# Patient Record
Sex: Male | Born: 1960 | Race: White | Hispanic: No | Smoking: Former smoker
Health system: Southern US, Community
[De-identification: ages and names within clinical notes are randomized; demographics above are authoritative.]

## PROBLEM LIST (undated history)

## (undated) DIAGNOSIS — M67922 Unspecified disorder of synovium and tendon, left upper arm: Secondary | ICD-10-CM

## (undated) DIAGNOSIS — Z8679 Personal history of other diseases of the circulatory system: Secondary | ICD-10-CM

## (undated) DIAGNOSIS — K219 Gastro-esophageal reflux disease without esophagitis: Secondary | ICD-10-CM

## (undated) DIAGNOSIS — M79642 Pain in left hand: Secondary | ICD-10-CM

## (undated) DIAGNOSIS — G4733 Obstructive sleep apnea (adult) (pediatric): Secondary | ICD-10-CM

## (undated) DIAGNOSIS — M79641 Pain in right hand: Secondary | ICD-10-CM

## (undated) DIAGNOSIS — I4510 Unspecified right bundle-branch block: Secondary | ICD-10-CM

## (undated) HISTORY — PX: CARPAL TUNNEL RELEASE: SHX101

## (undated) HISTORY — PX: JOINT REPLACEMENT: SHX530

## (undated) HISTORY — PX: TONSILLECTOMY: SUR1361

## (undated) HISTORY — PX: FRACTURE SURGERY: SHX138

## (undated) HISTORY — PX: ORIF ANKLE FRACTURE: SHX5408

---

## 1985-09-19 HISTORY — PX: UVULOPALATOPHARYNGOPLASTY: SHX827

## 2014-05-07 ENCOUNTER — Emergency Department (HOSPITAL_BASED_OUTPATIENT_CLINIC_OR_DEPARTMENT_OTHER): Payer: Managed Care, Other (non HMO)

## 2014-05-07 ENCOUNTER — Emergency Department (HOSPITAL_COMMUNITY)
Admission: EM | Admit: 2014-05-07 | Discharge: 2014-05-07 | Discharge status: Against Medical Advice | Payer: Managed Care, Other (non HMO) | Source: Home / Self Care | Attending: Emergency Medicine | Admitting: Emergency Medicine

## 2014-05-07 ENCOUNTER — Emergency Department (HOSPITAL_BASED_OUTPATIENT_CLINIC_OR_DEPARTMENT_OTHER)
Admission: EM | Admit: 2014-05-07 | Discharge: 2014-05-07 | Discharge status: Against Medical Advice | Payer: Managed Care, Other (non HMO) | Source: Home / Self Care | Attending: Emergency Medicine | Admitting: Emergency Medicine

## 2014-05-07 ENCOUNTER — Encounter (HOSPITAL_COMMUNITY): Payer: Self-pay | Admitting: Emergency Medicine

## 2014-05-07 ENCOUNTER — Encounter (HOSPITAL_BASED_OUTPATIENT_CLINIC_OR_DEPARTMENT_OTHER): Payer: Self-pay | Admitting: Emergency Medicine

## 2014-05-07 ENCOUNTER — Observation Stay (HOSPITAL_COMMUNITY)
Admission: EM | Admit: 2014-05-07 | Discharge: 2014-05-09 | Disposition: A | Discharge status: Home / Self Care | Payer: Managed Care, Other (non HMO) | Attending: Cardiology | Admitting: Cardiology

## 2014-05-07 DIAGNOSIS — R509 Fever, unspecified: Secondary | ICD-10-CM | POA: Diagnosis not present

## 2014-05-07 DIAGNOSIS — R079 Chest pain, unspecified: Secondary | ICD-10-CM | POA: Insufficient documentation

## 2014-05-07 DIAGNOSIS — R000 Tachycardia, unspecified: Secondary | ICD-10-CM

## 2014-05-07 DIAGNOSIS — Z87891 Personal history of nicotine dependence: Secondary | ICD-10-CM | POA: Insufficient documentation

## 2014-05-07 DIAGNOSIS — I313 Pericardial effusion (noninflammatory): Secondary | ICD-10-CM

## 2014-05-07 DIAGNOSIS — I3139 Other pericardial effusion (noninflammatory): Secondary | ICD-10-CM

## 2014-05-07 DIAGNOSIS — I319 Disease of pericardium, unspecified: Principal | ICD-10-CM | POA: Insufficient documentation

## 2014-05-07 DIAGNOSIS — R0602 Shortness of breath: Secondary | ICD-10-CM | POA: Diagnosis present

## 2014-05-07 DIAGNOSIS — H9209 Otalgia, unspecified ear: Secondary | ICD-10-CM | POA: Diagnosis not present

## 2014-05-07 DIAGNOSIS — R071 Chest pain on breathing: Secondary | ICD-10-CM

## 2014-05-07 LAB — COMPREHENSIVE METABOLIC PANEL
ALT: 57 U/L — ABNORMAL HIGH (ref 0–53)
ANION GAP: 14 (ref 5–15)
AST: 25 U/L (ref 0–37)
Albumin: 3.5 g/dL (ref 3.5–5.2)
Alkaline Phosphatase: 59 U/L (ref 39–117)
BILIRUBIN TOTAL: 1 mg/dL (ref 0.3–1.2)
BUN: 12 mg/dL (ref 6–23)
CALCIUM: 9.1 mg/dL (ref 8.4–10.5)
CHLORIDE: 99 meq/L (ref 96–112)
CO2: 24 meq/L (ref 19–32)
Creatinine, Ser: 0.8 mg/dL (ref 0.50–1.35)
GFR calc Af Amer: >90 mL/min (ref >90)
GFR calc non Af Amer: >90 mL/min (ref >90)
Glucose, Bld: 167 mg/dL — ABNORMAL HIGH (ref 70–99)
Potassium: 3.7 mEq/L (ref 3.7–5.3)
Sodium: 137 mEq/L (ref 137–147)
Total Protein: 7.6 g/dL (ref 6.0–8.3)

## 2014-05-07 LAB — CBC WITH DIFFERENTIAL/PLATELET
BASOS PCT: 1 % (ref 0–1)
Basophils Absolute: 0 10*3/uL (ref 0.0–0.1)
Eosinophils Absolute: 0 10*3/uL (ref 0.0–0.7)
Eosinophils Relative: 0 % (ref 0–5)
HEMATOCRIT: 38.7 % — AB (ref 39.0–52.0)
HEMOGLOBIN: 13.1 g/dL (ref 13.0–17.0)
LYMPHS PCT: 16 % (ref 12–46)
Lymphs Abs: 1.3 10*3/uL (ref 0.7–4.0)
MCH: 30.2 pg (ref 26.0–34.0)
MCHC: 33.9 g/dL (ref 30.0–36.0)
MCV: 89.2 fL (ref 78.0–100.0)
MONO ABS: 0.4 10*3/uL (ref 0.1–1.0)
MONOS PCT: 5 % (ref 3–12)
NEUTROS ABS: 6.5 10*3/uL (ref 1.7–7.7)
Neutrophils Relative %: 79 % — ABNORMAL HIGH (ref 43–77)
Platelets: 322 10*3/uL (ref 150–400)
RBC: 4.34 MIL/uL (ref 4.22–5.81)
RDW: 12.2 % (ref 11.5–15.5)
WBC: 8.3 10*3/uL (ref 4.0–10.5)

## 2014-05-07 LAB — PRO B NATRIURETIC PEPTIDE: PRO B NATRI PEPTIDE: 145.2 pg/mL — AB (ref 0–125)

## 2014-05-07 LAB — TROPONIN I: Troponin I: <0.3 ng/mL (ref <0.30)

## 2014-05-07 MED ORDER — ONDANSETRON HCL 4 MG/2ML IJ SOLN: 4.0000 mg | Freq: Four times a day (QID) | INTRAMUSCULAR | Status: DC | PRN

## 2014-05-07 MED ORDER — ACETAMINOPHEN 325 MG PO TABS
650.0000 mg | ORAL_TABLET | ORAL | Status: DC | PRN
  Administered 2014-05-08 (×3): 650 mg via ORAL
  Filled 2014-05-07 (×3): qty 2

## 2014-05-07 MED ORDER — ALPRAZOLAM 0.25 MG PO TABS
0.2500 mg | ORAL_TABLET | Freq: Two times a day (BID) | ORAL | Status: DC | PRN
  Administered 2014-05-08: 0.25 mg via ORAL
  Filled 2014-05-07: qty 1

## 2014-05-07 MED ORDER — IOHEXOL 350 MG/ML SOLN
100.0000 mL | Freq: Once | INTRAVENOUS | Status: AC | PRN
  Administered 2014-05-07: 100 mL via INTRAVENOUS

## 2014-05-07 MED ORDER — AMOXICILLIN-POT CLAVULANATE 875-125 MG PO TABS
1.0000 | ORAL_TABLET | Freq: Two times a day (BID) | ORAL | Status: DC
  Administered 2014-05-07 – 2014-05-09 (×4): 1 via ORAL
  Filled 2014-05-07 (×5): qty 1

## 2014-05-07 MED ORDER — ACETAMINOPHEN 325 MG PO TABS
650.0000 mg | ORAL_TABLET | Freq: Once | ORAL | Status: DC
  Filled 2014-05-07: qty 2

## 2014-05-07 MED ORDER — ZOLPIDEM TARTRATE 5 MG PO TABS: 5.0000 mg | ORAL_TABLET | Freq: Every evening | ORAL | Status: DC | PRN

## 2014-05-07 MED ORDER — MORPHINE SULFATE 4 MG/ML IJ SOLN: 4.0000 mg | Freq: Once | INTRAMUSCULAR | Status: DC

## 2014-05-07 NOTE — ED Provider Notes (Signed)
8:24 PM [facility] at [facility]. Pleuritic CP. Ct with large pericardial effusion. Pericardial effusion measures up to 3 cm in diameter. Pt insisted on going by private transportation. [facility], but [facility]. Discussed with pt. Can discuss with cardiology again and see if can work this up further [facility], but suspect would ultimately need transfer to [facility] for cardi[facility] and potentially CT surgery. Pt would just like to go to [facility] now and insists on driving himself. He has medical decision making capability.   Raeford RazorStephen Valena Ivanov, MD 05/07/14 2035

## 2014-05-07 NOTE — ED Notes (Signed)
[facility]

## 2014-05-07 NOTE — Discharge Instructions (Signed)
[facility] to [facility]. [address] [place], [place] [id number]

## 2014-05-07 NOTE — H&P (Signed)
Mitchell Cantu is an 53 y.o. male.   Chief Complaint: Shortness of breath and malaise HPI: Patient is a fairly active 53 year old gentleman who was admitted via emergency room, presenting with shortness of breath that started about 2 weeks ago. Patient has history of spider bite in his right eye and also left upper arm about 2 months ago was treated with antibiotics, thinks that it may help the black recluse spider.  2 weeks ago she started noticing shortness of breath with exertional activities, malaise and fatigue, stiffness his nose and ears but no cough or any discharge. His symptoms got worse, he came to the emergency room and was seen at [facility], CT scan of the chest to rule out pulmonary embolism essentially revealed a moderate-sized pericardial effusion measuring 3 cm. Patient was recommended admission and observation, patient walked home as he stated that he taken of it out. He then returned back to [facility] for further evaluation. Otherwise he states that he feels well, denies PND orthopnea, denies chest pain, has noticed mild low-grade temperature over the past 10 days to 2 weeks. He quit smoking about a year ago and uses electronic cigarettes. He does not use any illicit drugs, does not drink alcohol.  History reviewed. No pertinent past medical history. no history of hypertension, diabetes or hyperlipidemia.  History reviewed. No pertinent past surgical history.  History reviewed. No pertinent family history. is no family history of Provigil coronary artery disease or diabetes mellitus. Social History:  reports that he has never smoked. He does not have any smokeless tobacco history on file. He reports that he does not drink alcohol or use illicit drugs.  Allergies: No Known Allergies   (Not in a hospital admission)    Review of Systems - please see history of presenting illness, other systems negative.  Blood pressure 158/102, pulse 110, temperature 99 F (37.2  C), temperature source Oral, resp. rate 22, height 5' 9" (1.753 m), weight 99.791 kg (220 lb), SpO2 98.00%. General appearance: alert, cooperative, appears stated age and no distress Eyes: negative findings: lids and lashes normal Neck: no adenopathy, no carotid bruit, no JVD, supple, symmetrical, trachea midline and thyroid not enlarged, symmetric, no tenderness/mass/nodules Neck: JVP - normal, carotids 2+= without bruits Resp: clear to auscultation bilaterally Chest wall: no tenderness Cardio: regular rate and rhythm, S1, S2 normal, no murmur, click, rub or gallop GI: soft, non-tender; bowel sounds normal; no masses,  no organomegaly Extremities: extremities normal, atraumatic, no cyanosis or edema Pulses: 2+ and symmetric Skin: Skin color, texture, turgor normal. No rashes or lesions Neurologic: Grossly normal  Results for orders placed during the hospital encounter of 05/07/14 (from the past 48 hour(s))  CBC WITH DIFFERENTIAL     Status: Abnormal   Collection Time    05/07/14  2:55 PM      Result Value Ref Range   WBC 8.3  4.0 - 10.5 K/uL   RBC 4.34  4.22 - 5.81 MIL/uL   Hemoglobin 13.1  13.0 - 17.0 g/dL   HCT 38.7 (*) 39.0 - 52.0 %   MCV 89.2  78.0 - 100.0 fL   MCH 30.2  26.0 - 34.0 pg   MCHC 33.9  30.0 - 36.0 g/dL   RDW 12.2  11.5 - 15.5 %   Platelets 322  150 - 400 K/uL   Neutrophils Relative % 79 (*) 43 - 77 %   Neutro Abs 6.5  1.7 - 7.7 K/uL   Lymphocytes Relative 16  12 - 46 %   Lymphs Abs 1.3  0.7 - 4.0 K/uL   Monocytes Relative 5  3 - 12 %   Monocytes Absolute 0.4  0.1 - 1.0 K/uL   Eosinophils Relative 0  0 - 5 %   Eosinophils Absolute 0.0  0.0 - 0.7 K/uL   Basophils Relative 1  0 - 1 %   Basophils Absolute 0.0  0.0 - 0.1 K/uL  COMPREHENSIVE METABOLIC PANEL     Status: Abnormal   Collection Time    05/07/14  2:55 PM      Result Value Ref Range   Sodium 137  137 - 147 mEq/L   Potassium 3.7  3.7 - 5.3 mEq/L   Chloride 99  96 - 112 mEq/L   CO2 24  19 - 32 mEq/L    Glucose, Bld 167 (*) 70 - 99 mg/dL   BUN 12  6 - 23 mg/dL   Creatinine, Ser 0.80  0.50 - 1.35 mg/dL   Calcium 9.1  8.4 - 10.5 mg/dL   Total Protein 7.6  6.0 - 8.3 g/dL   Albumin 3.5  3.5 - 5.2 g/dL   AST 25  0 - 37 U/L   ALT 57 (*) 0 - 53 U/L   Alkaline Phosphatase 59  39 - 117 U/L   Total Bilirubin 1.0  0.3 - 1.2 mg/dL   GFR calc non Af Amer >90  >90 mL/min   GFR calc Af Amer >90  >90 mL/min   Comment: (NOTE)     The eGFR has been calculated using the CKD EPI equation.     This calculation has not been validated in all clinical situations.     eGFR's persistently <90 mL/min signify possible Chronic Kidney     Disease.   Anion gap 14  5 - 15  TROPONIN I     Status: None   Collection Time    05/07/14  2:55 PM      Result Value Ref Range   Troponin I <0.30  <0.30 ng/mL   Comment:            Due to the release kinetics of cTnI,     a negative result within the first hours     of the onset of symptoms does not rule out     myocardial infarction with certainty.     If myocardial infarction is still suspected,     repeat the test at appropriate intervals.  PRO B NATRIURETIC PEPTIDE     Status: Abnormal   Collection Time    05/07/14  2:55 PM      Result Value Ref Range   Pro B Natriuretic peptide (BNP) 145.2 (*) 0 - 125 pg/mL   Dg Chest 2 View  05/07/2014   CLINICAL DATA:  Chest pain with shortness of breath and chest tightness  EXAM: CHEST  2 VIEW  COMPARISON:  None.  FINDINGS: The lungs are well-expanded. There is no focal infiltrate. The cardiopericardial silhouette is top-normal in size. The central pulmonary vascularity is mildly prominent. There is no pulmonary interstitial or alveolar edema. There is no pneumothorax or pleural effusion. The bony thorax is normal.  IMPRESSION: Mild central pulmonary vascular prominence and enlargement of the cardiac silhouette may reflect low-grade CHF. There is no pneumonia nor significant pulmonary alveolar edema.   Electronically Signed    By: David  Martinique   On: 05/07/2014 15:11   Ct Angio Chest Pe W/cm &/or Wo Cm  05/07/2014  CLINICAL DATA:  Pleuritic chest pain. Shortness of breath. Tachycardia .  EXAM: CT ANGIOGRAPHY CHEST WITH CONTRAST  TECHNIQUE: Multidetector CT imaging of the chest was performed using the standard protocol during bolus administration of intravenous contrast. Multiplanar CT image reconstructions and MIPs were obtained to evaluate the vascular anatomy.  CONTRAST:  135m OMNIPAQUE IOHEXOL 350 MG/ML SOLN  COMPARISON:  Chest x-ray 05/07/2014.  FINDINGS: Thoracic aorta is atherosclerotic. No evidence of aneurysmal dissection. Pulmonary arteries are normal. Heart size normal. Prominent pericardial effusion measuring up to 3 cm in diameter noted. Coronary artery disease cannot be excluded.  Shotty mediastinal lymph nodes.  Thoracic soft is unremarkable.  Large airways are patent. No focal pulmonary infiltrate. Small left pleural effusion. No pneumothorax.  Fatty infiltration of the liver. Visualized upper abdominal structures otherwise unremarkable. No acute bony abnormality.  Review of the MIP images confirms the above findings.  IMPRESSION: 1. Large pericardial effusion. Pericardial effusion measures up to 3 cm in diameter. 2. Coronary artery disease cannot be excluded. No evidence of pulmonary embolus. These results were called by telephone at the time of interpretation on 05/07/2014 at 3:56 pm to Dr. MErnestina Patches, who verbally acknowledged these results.   Electronically Signed   By: TMarcello Moores Register   On: 05/07/2014 15:57    Labs:   Lab Results  Component Value Date   WBC 8.3 05/07/2014   HGB 13.1 05/07/2014   HCT 38.7* 05/07/2014   MCV 89.2 05/07/2014   PLT 322 05/07/2014    Recent Labs Lab 05/07/14 1455  NA 137  K 3.7  CL 99  CO2 24  BUN 12  CREATININE 0.80  CALCIUM 9.1  PROT 7.6  BILITOT 1.0  ALKPHOS 59  ALT 57*  AST 25  GLUCOSE 167*   Lab Results  Component Value Date   TROPONINI <0.30  05/07/2014    Lipid Panel  No results found for this basename: chol, trig, hdl, cholhdl, vldl, ldlcalc    EKG: Sinus tachycardia rate of 100 beats a minute, normal axis, right bundle branch block. No evidence of ischemia.   Assessment/Plan 1. Shortness of breath, dyspnea on exertion, generalized fatigue and malaise associated with low-grade temperature ongoing for the past 2 weeks. 2. Pericardial effusion, moderate without evidence of pericardial tamponade clinically. Blood pressure well maintained. Pulses paradoxus was evaluated at the bedside, there was no difference in blood pressure with respiration.  Recommendation: I suspect his pericardial effusion is due to post viral syndrome. He still has the headache, earache but no discharge. No nuchal tenderness or rigidity. I will immediately start the patient on antibiotics. Probable discharge in the morning, will recommend to lay off of work for a week. And obtain an echocardiogram in the morning.  GLaverda Page MD 05/07/2014, 11:40 PM PM[facility]iovascular. PMonroePager: [phone] Office: [phone] no answer: Cell:  [phone]

## 2014-05-07 NOTE — ED Notes (Signed)
Called pt for triage X 3 with no answer  

## 2014-05-07 NOTE — ED Notes (Signed)
Reports CP with SHOB and tightness x 2 weeks. Sts feeling it deep in his chest. Thought he pulled muscle initially.

## 2014-05-07 NOTE — ED Notes (Signed)
Pt refusing to be admitted, however states he will go home from ED then return to [facility] for further evaluation. Discussed risks of leaving, AMA signed and pt verbalizes understanding.

## 2014-05-07 NOTE — ED Provider Notes (Signed)
CSN: [id number]     Arrival date & time 05/07/14  2121 History   First MD Initiated Contact with Patient 05/07/14 2254     Chief Complaint  Patient presents with  . Shortness of Breath    HPI Patient presents to the emergency room for further evaluation of a pericardial effusion.  Patient has been having trouble with shortness of breath and intermittent chest discomfort for the last couple of weeks.  He initially thought it was a muscle strain. He works at [employer]. He went and saw the nurse at work who listened to his heart and lungs and told him that his lungs were clear. Patient states he's felt more fatigued. He is also getting some shortness exertion.   [facility]. He had laboratory testing as well as a CT scan of his chest. Initially they were concerned about the possibility of a pulmonary embolism however the CT scan revealed a large pericardial effusion.   The [facility] however the patient refused ambulance transport. He decided to drive himself to the emergency department.  History reviewed. No pertinent past medical history. History reviewed. No pertinent past surgical history. History reviewed. No pertinent family history. History  Substance Use Topics  . Smoking status: Never Smoker   . Smokeless tobacco: Not on file  . Alcohol Use: No    Review of Systems  All other systems reviewed and are negative.     Allergies  Review of patient's allergies indicates no known allergies.  Home Medications   Prior to Admission medications   Not on File   BP 158/102  Pulse 110  Temp(Src) 99 F (37.2 C) (Oral)  Resp 22  Ht 5\' 9"  (1.753 m)  Wt 220 lb (99.791 kg)  BMI 32.47 kg/m2  SpO2 98% Physical Exam  Nursing note and vitals reviewed. Constitutional: He appears well-developed and well-nourished. No distress.  HENT:  Head: Normocephalic and atraumatic.  Right Ear: External ear  normal.  Left Ear: External ear normal.  Eyes: Conjunctivae are normal. Right eye exhibits no discharge. Left eye exhibits no discharge. No scleral icterus.  Neck: Neck supple. No tracheal deviation present.  Cardiovascular: Regular rhythm and intact distal pulses.  Tachycardia present.  Exam reveals friction rub.   Pulmonary/Chest: Effort normal and breath sounds normal. No stridor. No respiratory distress. He has no wheezes. He has no rales.  Abdominal: Soft. Bowel sounds are normal. He exhibits no distension. There is no tenderness. There is no rebound and no guarding.  Musculoskeletal: He exhibits no edema and no tenderness.  Neurological: He is alert. He has normal strength. No cranial nerve deficit (no facial droop, extraocular movements intact, no slurred speech) or sensory deficit. He exhibits normal muscle tone. He displays no seizure activity. Coordination normal.  Skin: Skin is warm and dry. No rash noted.  Psychiatric: He has a normal mood and affect.    ED Course  Procedures (including critical care time) Labs Review Labs Reviewed - No data to display  Imaging Review Dg Chest 2 View  05/07/2014   CLINICAL DATA:  Chest pain with shortness of breath and chest tightness  EXAM: CHEST  2 VIEW  COMPARISON:  None.  FINDINGS: The lungs are well-expanded. There is no focal infiltrate. The cardiopericardial silhouette is top-normal in size. The central pulmonary vascularity is mildly prominent. There is no pulmonary interstitial or alveolar edema. There is no pneumothorax or pleural  effusion. The bony thorax is normal.  IMPRESSION: Mild central pulmonary vascular prominence and enlargement of the cardiac silhouette may reflect low-grade CHF. There is no pneumonia nor significant pulmonary alveolar edema.   Electronically Signed   By: David  [place]   On: 05/07/2014 15:11   Ct Angio Chest Pe W/cm &/or Wo Cm  05/07/2014   CLINICAL DATA:  Pleuritic chest pain. Shortness of breath. Tachycardia .   EXAM: CT ANGIOGRAPHY CHEST WITH CONTRAST  TECHNIQUE: Multidetector CT imaging of the chest was performed using the standard protocol during bolus administration of intravenous contrast. Multiplanar CT image reconstructions and MIPs were obtained to evaluate the vascular anatomy.  CONTRAST:  OMNIPAQUE IOHEXOL 350 MG/ML SOLN  COMPARISON:  Chest x-ray 05/07/2014.  FINDINGS: Thoracic aorta is atherosclerotic. No evidence of aneurysmal dissection. Pulmonary arteries are normal. Heart size normal. Prominent pericardial effusion measuring up to 3 cm in diameter noted. Coronary artery disease cannot be excluded.  Shotty mediastinal lymph nodes.  Thoracic soft is unremarkable.  Large airways are patent. No focal pulmonary infiltrate. Small left pleural effusion. No pneumothorax.  Fatty infiltration of the liver. Visualized upper abdominal structures otherwise unremarkable. No acute bony abnormality.  Review of the MIP images confirms the above findings.  IMPRESSION: 1. Large pericardial effusion. Pericardial effusion measures up to 3 cm in diameter. 2. Coronary artery disease cannot be excluded. No evidence of pulmonary embolus. These results were called by telephone at the time of interpretation on 05/07/2014 at 3:56 pm to Dr. Toy Cookey , who verbally acknowledged these results.   Electronically Signed   By: Maisie Fus  Register   On: 05/07/2014 15:57     EKG Interpretation   Date/Time:  Wednesday May 07 2014 21:29:14 EDT Ventricular Rate:  113 PR Interval:  138 QRS Duration: 138 QT Interval:  350 QTC Calculation: 480 R Axis:   63 Text Interpretation:  Sinus tachycardia Right bundle branch block Abnormal  ECG No significant change since last tracing Confirmed by Jaylon Boylen  MD-J, Rosellen Lichtenberger  ([id number]) on 05/07/2014 11:14:44 PM      MDM   Final diagnoses:  Pericardial effusion    Pt had laboratory testing and CT scan performed today at [facility].     I discussed the case with Dr Jacinto Halim.  He will evaluate the  patient in the ED.    Linwood Dibbles, MD 05/07/14 [phone]

## 2014-05-07 NOTE — ED Notes (Signed)
[facility] and refused transport to [facility] d/t needed to feed dogs; pt reports having SOB and cp for a few weeks; [facility]; charge RN made aware that pt arrived

## 2014-05-07 NOTE — ED Notes (Signed)
Went to ED room and pt is sitting in chair fully dressed and tells me he wants to go home in his car, feed his dog, then go to [facility].  Discussed process for admission- EDP informed.

## 2014-05-07 NOTE — ED Notes (Signed)
[facility] and [facility].

## 2014-05-07 NOTE — ED Notes (Signed)
Patient transported to CT 

## 2014-05-07 NOTE — ED Provider Notes (Signed)
CSN: [id number]     Arrival date & time 05/07/14  1421 History   First MD Initiated Contact with Patient 05/07/14 1508     Chief Complaint  Patient presents with  . Chest Pain     (Consider location/radiation/quality/duration/timing/severity/associated sxs/prior Treatment) Patient is a 53 y.o. male presenting with chest pain. The history is provided by the patient. No language interpreter was used.  Chest Pain Pain location:  Substernal area Pain quality: sharp   Radiates to: occasional ear pain. Pain radiates to the back: no   Pain severity:  Moderate Onset quality:  Unable to specify Duration:  2 weeks Timing:  Constant Progression:  Unchanged Chronicity:  New Context: at rest   Relieved by:  Nothing Worsened by:  Coughing and deep breathing Ineffective treatments:  Rest Associated symptoms: shortness of breath   Associated symptoms: no abdominal pain, no back pain, no cough, no dizziness, no dysphagia, no fatigue, no fever, no headache, no nausea, no numbness, no syncope, not vomiting and no weakness   Risk factors: male sex   Risk factors: no coronary artery disease, no diabetes mellitus, no high cholesterol, no hypertension, no prior DVT/PE and no smoking (quit 1 yr ago)     History reviewed. No pertinent past medical history. History reviewed. No pertinent past surgical history. No family history on file. History  Substance Use Topics  . Smoking status: Never Smoker   . Smokeless tobacco: Not on file  . Alcohol Use: No    Review of Systems  Constitutional: Negative for fever, activity change, appetite change and fatigue.  HENT: Negative for congestion, facial swelling, rhinorrhea and trouble swallowing.   Eyes: Negative for photophobia and pain.  Respiratory: Positive for shortness of breath. Negative for cough and chest tightness.   Cardiovascular: Positive for chest pain. Negative for leg swelling and syncope.  Gastrointestinal: Negative for nausea, vomiting,  abdominal pain, diarrhea and constipation.  Endocrine: Negative for polydipsia and polyuria.  Genitourinary: Negative for dysuria, urgency, decreased urine volume and difficulty urinating.  Musculoskeletal: Negative for back pain and gait problem.  Skin: Negative for color change, rash and wound.  Allergic/Immunologic: Negative for immunocompromised state.  Neurological: Negative for dizziness, facial asymmetry, speech difficulty, weakness, numbness and headaches.  Psychiatric/Behavioral: Negative for confusion, decreased concentration and agitation.      Allergies  Review of patient's allergies indicates no known allergies.  Home Medications   Prior to Admission medications   Not on File   BP 116/69  Pulse 112  Temp(Src) 98.2 F (36.8 C) (Oral)  Resp 16  Ht 5\' 9"  (1.753 m)  Wt 220 lb (99.791 kg)  BMI 32.47 kg/m2  SpO2 98% Physical Exam  Constitutional: He is oriented to person, place, and time. He appears well-developed and well-nourished. No distress.  HENT:  Head: Normocephalic and atraumatic.  Mouth/Throat: No oropharyngeal exudate.  Eyes: Pupils are equal, round, and reactive to light.  Neck: Normal range of motion. Neck supple.  Cardiovascular: Normal heart sounds.  An irregular rhythm present. Tachycardia present.  Exam reveals no gallop and no friction rub.   No murmur heard. Pulmonary/Chest: Effort normal and breath sounds normal. No respiratory distress. He has no wheezes. He has no rales.  Abdominal: Soft. Bowel sounds are normal. He exhibits no distension and no mass. There is no tenderness. There is no rebound and no guarding.  Musculoskeletal: Normal range of motion. He exhibits no edema and no tenderness.  Neurological: He is alert and oriented to person, place, and  time.  Skin: Skin is warm and dry.  Psychiatric: He has a normal mood and affect.    ED Course  Procedures (including critical care time) Labs Review Labs Reviewed  CBC WITH DIFFERENTIAL -  Abnormal; Notable for the following:    HCT 38.7 (*)    Neutrophils Relative % 79 (*)    All other components within normal limits  COMPREHENSIVE METABOLIC PANEL - Abnormal; Notable for the following:    Glucose, Bld 167 (*)    ALT 57 (*)    All other components within normal limits  PRO B NATRIURETIC PEPTIDE - Abnormal; Notable for the following:    Pro B Natriuretic peptide (BNP) 145.2 (*)    All other components within normal limits  TROPONIN I    Imaging Review Dg Chest 2 View  05/07/2014   CLINICAL DATA:  Chest pain with shortness of breath and chest tightness  EXAM: CHEST  2 VIEW  COMPARISON:  None.  FINDINGS: The lungs are well-expanded. There is no focal infiltrate. The cardiopericardial silhouette is top-normal in size. The central pulmonary vascularity is mildly prominent. There is no pulmonary interstitial or alveolar edema. There is no pneumothorax or pleural effusion. The bony thorax is normal.  IMPRESSION: Mild central pulmonary vascular prominence and enlargement of the cardiac silhouette may reflect low-grade CHF. There is no pneumonia nor significant pulmonary alveolar edema.   Electronically Signed   By: David  [place]   On: 05/07/2014 15:11   Ct Angio Chest Pe W/cm &/or Wo Cm  05/07/2014   CLINICAL DATA:  Pleuritic chest pain. Shortness of breath. Tachycardia .  EXAM: CT ANGIOGRAPHY CHEST WITH CONTRAST  TECHNIQUE: Multidetector CT imaging of the chest was performed using the standard protocol during bolus administration of intravenous contrast. Multiplanar CT image reconstructions and MIPs were obtained to evaluate the vascular anatomy.  CONTRAST:  100mL OMNIPAQUE IOHEXOL 350 MG/ML SOLN  COMPARISON:  Chest x-ray 05/07/2014.  FINDINGS: Thoracic aorta is atherosclerotic. No evidence of aneurysmal dissection. Pulmonary arteries are normal. Heart size normal. Prominent pericardial effusion measuring up to 3 cm in diameter noted. Coronary artery disease cannot be excluded.  Shotty  mediastinal lymph nodes.  Thoracic soft is unremarkable.  Large airways are patent. No focal pulmonary infiltrate. Small left pleural effusion. No pneumothorax.  Fatty infiltration of the liver. Visualized upper abdominal structures otherwise unremarkable. No acute bony abnormality.  Review of the MIP images confirms the above findings.  IMPRESSION: 1. Large pericardial effusion. Pericardial effusion measures up to 3 cm in diameter. 2. Coronary artery disease cannot be excluded. No evidence of pulmonary embolus. These results were called by telephone at the time of interpretation on 05/07/2014 at 3:56 pm to Dr. Toy CookeyMEGAN DOCHERTY , who verbally acknowledged these results.   Electronically Signed   By: Maisie Fushomas  Register   On: 05/07/2014 15:57     EKG Interpretation   Date/Time:  Wednesday May 07 2014 14:37:59 EDT Ventricular Rate:  102 PR Interval:  152 QRS Duration: 138 QT Interval:  350 QTC Calculation: 456 R Axis:   43 Text Interpretation:  Sinus tachycardia Right bundle branch block Abnormal  ECG No prior for comparison Confirmed by DOCHERTY  MD, MEGAN ([id number]) on  05/07/2014 3:23:19 PM      MDM   Final diagnoses:  Pericardial effusion  Chest pain on breathing  Tachycardia    Pt is a 53 y.o. male with Pmhx as above who presents with about 2 weeks of central, sharp, pleuritic chest pain with  assoc SOB. No hx of similar, no leg pain or swelling. No fever. On PE, pt tachycardic 115-120, BP stable. CP not reproducible, lungs clear. EKG w/ sinus tachycardia, RBBB, no priors. EKG w/ low grade CHF.  Description of symptoms concerning for PE, though he has no known risk factors. He scores 4.5 points using Well's criteria.   CT shows no PE, but does show large pericardial effusion. Given assoc CP, SOB, tachycardia, I feel he needs cardiology eval and admission for echo and possible pericardiocentesis. Pt will not allow me to transport him by ambulance and will sign out AMA. He has the capacity to  make this decision and understands the risks, including death. I spoke w/ cardiology, Dr. Lynelle Doctor in [facility], as well as [facility] charge nurse who are expecting him. He agrees to go to the ED after he leaves food/water for his dog.         Toy Cookey, MD 05/07/14 1723

## 2014-05-07 NOTE — Discharge Instructions (Signed)
Pericardial Effusion °Pericardial effusion is an accumulation of extra fluid in the pericardial space. This is the space between the heart and the sac that surrounds it. This space normally contains a small amount of fluid which serves as lubrication for the heart inside the sac. If the amount of fluid increases, it causes problems for the working of the heart. This is called a heart (cardiac) tamponade.  °CAUSES  °A higher incidence of pericardial effusion is associated with certain diseases. Some common causes of pericardial effusion are: °· Infections (bacterial, viral, fungal, from AIDS, etc.). °· Cancer which has spread to the pericardial sac. °· Fluid accumulation in the sac after a heart attack or open heart surgery. °· Injury (a fall with chest injury or as a result of a knife or gunshot wound) with bleeding into the pericardial sac. °· Immune diseases (such as Rheumatoid Arthritis) and other arthritis conditions. °· Reactions (uncommon) to medications. °SYMPTOMS  °Very small effusions may cause no problems. Even a small effusion if it comes on rapidly can be deadly. °Some common symptoms are: °· Chest pain, pressure, discomfort. °· Light-headed feeling, fainting. °· Cough, shortness of breath. °· Feeling of palpitations. °· Hiccoughs °· Anxiety or confusion °DIAGNOSIS  °Your caregiver may do a number of blood tests and imaging tests (like X-rays) to help find the cause.  °· ECHO (Echocardiographic stress test) has become the diagnostic method of choice. This is due to its portability and availability. CT and MRI are also used, and may be more accurate. °· Pericardioscopy, where available, uses a telescope-like instrument to look inside the pericardial sac. °¨ This may be helpful in cases of unexplained pericardial effusions. It allows your caregiver to look at the pericardium and do pericardial biopsies if something abnormal is found. °· Sometimes fluid may be removed to help with diagnosing the cause of  the accumulation. This is called a pericardiocentesis. °TREATMENT  °Treatment is directed at removal of the extra pericardial fluid and treating the cause. Small amounts of effusion that are not causing problems can simply be watched. If the fluid is accumulating rapidly and resulting in cardiac tamponade, this can be life-threatening. Emergency removal of fluid must be done.  °SEEK IMMEDIATE MEDICAL CARE IF:  °· You develop chest pain, irregular heart beat (palpitations) or racing heart, shortness of breath, or begin sweating. °· You become lightheaded or pass out. °· You develop increasing swelling of the legs. °Document Released: 05/03/2005 Document Revised: 11/28/2011 Document Reviewed: 04/18/2008 °ExitCare® Patient Information ©2015 [employer]. This information is not intended to replace advice given to you by your health care provider. Make sure you discuss any questions you have with your health care provider. ° °

## 2014-05-08 ENCOUNTER — Encounter (HOSPITAL_COMMUNITY): Payer: Self-pay | Admitting: *Deleted

## 2014-05-08 LAB — CBC
HEMATOCRIT: 38 % — AB (ref 39.0–52.0)
HEMOGLOBIN: 13 g/dL (ref 13.0–17.0)
MCH: 30.6 pg (ref 26.0–34.0)
MCHC: 34.2 g/dL (ref 30.0–36.0)
MCV: 89.4 fL (ref 78.0–100.0)
Platelets: 335 10*3/uL (ref 150–400)
RBC: 4.25 MIL/uL (ref 4.22–5.81)
RDW: 12.6 % (ref 11.5–15.5)
WBC: 9.9 10*3/uL (ref 4.0–10.5)

## 2014-05-08 LAB — LIPID PANEL
CHOLESTEROL: 129 mg/dL (ref 0–200)
HDL: 25 mg/dL — ABNORMAL LOW (ref >39)
LDL Cholesterol: 82 mg/dL (ref 0–99)
Total CHOL/HDL Ratio: 5.2 RATIO
Triglycerides: 108 mg/dL (ref <150)
VLDL: 22 mg/dL (ref 0–40)

## 2014-05-08 LAB — TSH: TSH: 5.28 u[IU]/mL — ABNORMAL HIGH (ref 0.350–4.500)

## 2014-05-08 LAB — SEDIMENTATION RATE: SED RATE: 45 mm/h — AB (ref 0–16)

## 2014-05-08 MED ORDER — PANTOPRAZOLE SODIUM 40 MG PO PACK
40.0000 mg | PACK | Freq: Every day | ORAL | Status: DC
  Administered 2014-05-09: 40 mg via ORAL
  Filled 2014-05-08 (×2): qty 20

## 2014-05-08 MED ORDER — INDOMETHACIN ER 75 MG PO CPCR
75.0000 mg | ORAL_CAPSULE | Freq: Every day | ORAL | Status: DC
  Administered 2014-05-08 – 2014-05-09 (×2): 75 mg via ORAL
  Filled 2014-05-08 (×2): qty 1

## 2014-05-08 MED ORDER — COLCHICINE 0.6 MG PO TABS
0.6000 mg | ORAL_TABLET | Freq: Two times a day (BID) | ORAL | Status: DC
  Administered 2014-05-08 – 2014-05-09 (×3): 0.6 mg via ORAL
  Filled 2014-05-08 (×4): qty 1

## 2014-05-08 NOTE — Progress Notes (Signed)
  Echocardiogram 2D Echocardiogram has been performed.  Leta JunglingCooper, Glema Takaki M 05/08/2014, 12:19 PM

## 2014-05-08 NOTE — Consult Note (Signed)
Reason for Consult: Bilateral otalgia Referring Physician: Delrae Rend, MD  HPI:  Mitchell Cantu is an 53 y.o. male who was admitted yesterday for treatment of his pericardial effusion. In addition to his shortness of breath and dyspnea, the patient also complains of intermittent bilateral otalgia for the past 2 weeks. He denies any otorrhea, vertigo, or significant change in his hearing. He quit smoking about a year ago and uses electronic cigarettes. He does not use any illicit drugs, does not drink alcohol. He also denies any dysphagia, odynophagia, or dysphonia.  History reviewed. No pertinent past medical history.  Past Surgical History  Procedure Laterality Date  . Fracture surgery    . Tonsillectomy    . Joint replacement      History reviewed. No pertinent family history.  Social History:  reports that he quit smoking about 14 months ago. He does not have any smokeless tobacco history on file. He reports that he drinks alcohol. He reports that he does not use illicit drugs.  Allergies: No Known Allergies  Prior to Admission medications   Medication Sig Start Date End Date Taking? Authorizing Provider  Aspirin-Acetaminophen-Caffeine (GOODY HEADACHE PO) Take 1 packet by mouth daily as needed (headache / pain).   Yes Historical Provider, MD    Medications:  I have reviewed the patient's current medications. Scheduled: . amoxicillin-clavulanate  1 tablet Oral BID  . colchicine  0.6 mg Oral BID  . indomethacin  75 mg Oral Daily  . [START ON 05/09/2014] pantoprazole sodium  40 mg Oral QAC breakfast   WUJ:WJXBJYNWGNFAO, ALPRAZolam, ondansetron (ZOFRAN) IV, zolpidem  Results for orders placed during the hospital encounter of 05/07/14 (from the past 48 hour(s))  LIPID PANEL     Status: Abnormal   Collection Time    05/08/14  1:02 AM      Result Value Ref Range   Cholesterol 129  0 - 200 mg/dL   Triglycerides 130  <865 mg/dL   HDL 25 (*) >78 mg/dL   Total CHOL/HDL Ratio 5.2      VLDL 22  0 - 40 mg/dL   LDL Cholesterol 82  0 - 99 mg/dL   Comment:            Total Cholesterol/HDL:CHD Risk     Coronary Heart Disease Risk Table                         Men   Women      1/2 Average Risk   3.4   3.3      Average Risk       5.0   4.4      2 X Average Risk   9.6   7.1      3 X Average Risk  23.4   11.0                Use the calculated Patient Ratio     above and the CHD Risk Table     to determine the patient's CHD Risk.                ATP III CLASSIFICATION (LDL):      <100     mg/dL   Optimal      469-629  mg/dL   Near or Above                        Optimal      130-159  mg/dL   Borderline      409-811  mg/dL   High      >914     mg/dL   Very High  SEDIMENTATION RATE     Status: Abnormal   Collection Time    05/08/14  1:02 AM      Result Value Ref Range   Sed Rate 45 (*) 0 - 16 mm/hr  TSH     Status: Abnormal   Collection Time    05/08/14  1:02 AM      Result Value Ref Range   TSH 5.280 (*) 0.350 - 4.500 uIU/mL  CBC     Status: Abnormal   Collection Time    05/08/14  1:02 AM      Result Value Ref Range   WBC 9.9  4.0 - 10.5 K/uL   RBC 4.25  4.22 - 5.81 MIL/uL   Hemoglobin 13.0  13.0 - 17.0 g/dL   HCT 78.2 (*) 95.6 - 21.3 %   MCV 89.4  78.0 - 100.0 fL   MCH 30.6  26.0 - 34.0 pg   MCHC 34.2  30.0 - 36.0 g/dL   RDW 08.6  57.8 - 46.9 %   Platelets 335  150 - 400 K/uL    Dg Chest 2 View  05/07/2014   CLINICAL DATA:  Chest pain with shortness of breath and chest tightness  EXAM: CHEST  2 VIEW  COMPARISON:  None.  FINDINGS: The lungs are well-expanded. There is no focal infiltrate. The cardiopericardial silhouette is top-normal in size. The central pulmonary vascularity is mildly prominent. There is no pulmonary interstitial or alveolar edema. There is no pneumothorax or pleural effusion. The bony thorax is normal.  IMPRESSION: Mild central pulmonary vascular prominence and enlargement of the cardiac silhouette may reflect low-grade CHF. There is no  pneumonia nor significant pulmonary alveolar edema.   Electronically Signed   By: David  [place]   On: 05/07/2014 15:11   Ct Angio Chest Pe W/cm &/or Wo Cm  05/07/2014   CLINICAL DATA:  Pleuritic chest pain. Shortness of breath. Tachycardia .  EXAM: CT ANGIOGRAPHY CHEST WITH CONTRAST  TECHNIQUE: Multidetector CT imaging of the chest was performed using the standard protocol during bolus administration of intravenous contrast. Multiplanar CT image reconstructions and MIPs were obtained to evaluate the vascular anatomy.  CONTRAST:  OMNIPAQUE IOHEXOL 350 MG/ML SOLN  COMPARISON:  Chest x-ray 05/07/2014.  FINDINGS: Thoracic aorta is atherosclerotic. No evidence of aneurysmal dissection. Pulmonary arteries are normal. Heart size normal. Prominent pericardial effusion measuring up to 3 cm in diameter noted. Coronary artery disease cannot be excluded.  Shotty mediastinal lymph nodes.  Thoracic soft is unremarkable.  Large airways are patent. No focal pulmonary infiltrate. Small left pleural effusion. No pneumothorax.  Fatty infiltration of the liver. Visualized upper abdominal structures otherwise unremarkable. No acute bony abnormality.  Review of the MIP images confirms the above findings.  IMPRESSION: 1. Large pericardial effusion. Pericardial effusion measures up to 3 cm in diameter. 2. Coronary artery disease cannot be excluded. No evidence of pulmonary embolus. These results were called by telephone at the time of interpretation on 05/07/2014 at 3:56 pm to Dr. Toy Cookey , who verbally acknowledged these results.   Electronically Signed   By: Maisie Fus  Register   On: 05/07/2014 15:57   Review of Systems - as noted above, other systems negative.  Blood pressure 105/67, pulse 77, temperature 98.4 F (36.9 C), temperature source Oral, resp. rate 18, height 5'  9" (1.753 m), weight 222 lb 11.2 oz (101.016 kg), SpO2 98.00%.  Exam: His pupils are equal, round, reactive to light. Extraocular motion is intact.  Examination of the ears shows normal auricles and external auditory canals bilaterally. Both tympanic membranes are intact. No middle ear effusion or hemotympanum is noted. Nasal examination shows normal mucosa, septum, turbinates. Facial examination shows no asymmetry. Palpation of the face elicit no significant tenderness. Oral cavity examination shows no mucosal lacerations. No significant trismus is noted. Palpation of the neck reveals no lymphadenopathy or mass. The trachea is midline. The thyroid is not significantly enlarged. Cranial nerves 2-12 are all grossly in tact.  Assessment/Plan: Bilateral referred otalgia. He has a normal otologic examination today. His ear canals, tympanic membranes, and middle ear spaces are all normal. There is no evidence of TMJ disorder, parotitis, or pharyngitis. The source of the referred otalgia is likely musculoskeletal in origin. The patient will be started on NSAIDs. He may follow up with me as an outpatient after discharge.  Riddik Senna,SUI W 05/08/2014, 1:07 PM

## 2014-05-08 NOTE — Progress Notes (Signed)
Subjective:  Patient complains of pain in his years, also states that on taking deep breath it hurts in his chest, described as 4 out of 10 in intensity.  No obvious shortness of breath although he states that taking deep breath hurts him so he's been taking shallow breaths.  He is also noticed mild feverish-like symptoms and feels chills at times.  Objective:  Vital Signs in the last 24 hours: Temp:  [98.4 F (36.9 C)-99.4 F (37.4 C)] 98.4 F (36.9 C) (08/20 0544) Pulse Rate:  [77-110] 77 (08/20 0544) Resp:  [18-26] 18 (08/20 0544) BP: (105-158)/(64-102) 105/67 mmHg (08/20 0544) SpO2:  [96 %-98 %] 98 % (08/20 0544) Weight:  [99.791 kg (220 lb)-101.016 kg (222 lb 11.2 oz)] 101.016 kg (222 lb 11.2 oz) (08/20 0300)  Intake/Output from previous day: 08/19 0701 - 08/20 0700 In: -  Out: 200 [Urine:200]  Physical Exam:   General appearance: alert, appears stated age, no distress and mildly obese Eyes: negative findings: lids and lashes normal and conjunctivae and sclerae normal Neck: no adenopathy, no carotid bruit, supple, symmetrical, trachea midline and JVD present. Neck: JVP - normal, carotids 2+= without bruits Resp: clear to auscultation bilaterally Chest wall: no tenderness Cardio: regular rate and rhythm, S1, S2 normal, no murmur, click, rub or gallop GI: soft, non-tender; bowel sounds normal; no masses,  no organomegaly Extremities: extremities normal, atraumatic, no cyanosis or edema    Lab Results: BMP  Recent Labs  05/07/14 1455  NA 137  K 3.7  CL 99  CO2 24  GLUCOSE 167*  BUN 12  CREATININE 0.80  CALCIUM 9.1  GFRNONAA >90  GFRAA >90    CBC  Recent Labs Lab 05/07/14 1455 05/08/14 0102  WBC 8.3 9.9  RBC 4.34 4.25  HGB 13.1 13.0  HCT 38.7* 38.0*  PLT 322 335  MCV 89.2 89.4  MCH 30.2 30.6  MCHC 33.9 34.2  RDW 12.2 12.6  LYMPHSABS 1.3  --   MONOABS 0.4  --   EOSABS 0.0  --   BASOSABS 0.0  --     HEMOGLOBIN A1C No results found for this  basename: HGBA1C, MPG    Cardiac Panel (last 3 results)  Recent Labs  05/07/14 1455  TROPONINI <0.30    BNP (last 3 results)  Recent Labs  05/07/14 1455  PROBNP 145.2*    TSH  Recent Labs  05/08/14 0102  TSH 5.280*    CHOLESTEROL  Recent Labs  05/08/14 0102  CHOL 129    Hepatic Function Panel  Recent Labs  05/07/14 1455  PROT 7.6  ALBUMIN 3.5  AST 25  ALT 57*  ALKPHOS 59  BILITOT 1.0    Imaging: Imaging results have been reviewed  Cardiac Studies: echocardiogram 05/08/2014: Normal left ventricular systolic function, EF 55-60%.  There is moderate pericardial effusion, free-floating, measures approximately 2.75 cm.  No Doppler evidence of tamponade.  IVC dilated with decreased respiratory variation suggests increased central venous pressure/right heart pressure.  EKG 05/07/2014: Sinus tachycardia rate of 100 beats a minute, normal axis, right bundle branch block. No evidence of ischemia.  Assessment/Plan:  1.  Symptoms of malaise, shortness of breath, headaches and chills I'll suggest post viral syndrome.  Suspect pericardial effusion probably due to post viral syndrome.  There is no medical or Doppler evidence of tamponade.  Mild elevated central venous pressure is evident.  ENT consultation much appreciated.  Recommendation: I have already started the patient on Indocin and also Colchicine.  Empiric antibiotics  have been started.  I will observe him overnight today, potential discharge in the morning.   Pamella PertGANJI,JAGADEESH R, M.D. 05/08/2014, 5:20 PM [facility] Pager: [phone] Office: [phone] If no answer: [phone]

## 2014-05-08 NOTE — Care Management Note (Unsigned)
    Page 1 of 1   05/08/2014     3:23:07 PM CARE MANAGEMENT NOTE 05/08/2014  Patient:  Mitchell HarbourRNOLD,Mitchell   Account Number:  [id number]  Date Initiated:  05/08/2014  Documentation initiated by:  Khadim Lundberg  Subjective/Objective Assessment:   Pt adm with SOB, pericardial effusion.  PTA, pt independent of ADLs.     Action/Plan:   Will follow for dc needs as pt progresses.   Anticipated DC Date:  05/09/2014   Anticipated DC Plan:  HOME/SELF CARE      DC Planning Services  CM consult      Choice offered to / List presented to:             Status of service:  In process, will continue to follow Medicare Important Message given?   (If response is "NO", the following Medicare IM given date fields will be blank) Date Medicare IM given:   Medicare IM given by:   Date Additional Medicare IM given:   Additional Medicare IM given by:    Discharge Disposition:    Per UR Regulation:  Reviewed for med. necessity/level of care/duration of stay  If discussed at Long Length of Stay Meetings, dates discussed:    Comments:

## 2014-05-08 NOTE — Progress Notes (Signed)
UR completed 

## 2014-05-08 NOTE — Progress Notes (Signed)
Patient admitted from ED with pericardial effusion. Alert and oriented. Vital sign stable. Complains of ear ache tylenol po given as ordered. Patient eating at this time. Will continue to monitor.

## 2014-05-09 MED ORDER — COLCHICINE 0.6 MG PO TABS: 0.6000 mg | ORAL_TABLET | Freq: Two times a day (BID) | ORAL | Status: DC

## 2014-05-09 MED ORDER — PANTOPRAZOLE SODIUM 40 MG PO PACK: 40.0000 mg | PACK | Freq: Every day | ORAL | Status: DC

## 2014-05-09 MED ORDER — AMOXICILLIN-POT CLAVULANATE 875-125 MG PO TABS: 1.0000 | ORAL_TABLET | Freq: Two times a day (BID) | ORAL | Status: DC

## 2014-05-09 MED ORDER — INDOMETHACIN ER 75 MG PO CPCR: 75.0000 mg | ORAL_CAPSULE | Freq: Every day | ORAL | Status: DC

## 2014-05-09 NOTE — Discharge Instructions (Signed)
Pericarditis °Pericarditis is swelling (inflammation) of the pericardium. The pericardium is a thin, double-layered, fluid-filled tissue sac that surrounds the heart. The purpose of the pericardium is to contain the heart in the chest cavity and keep the heart from overexpanding. Different types of pericarditis can occur, such as: °· Acute pericarditis. Inflammation can develop suddenly in acute pericarditis. °· Chronic pericarditis. Inflammation develops gradually and is long-lasting in chronic pericarditis. °· Constrictive pericarditis. In this type of pericarditis, the layers of the pericardium stiffen and develop scar tissue. The scar tissue thickens and sticks together. This makes it difficult for the heart to pump and work as it normally does. °CAUSES  °Pericarditis can be caused from different conditions, such as: °· A bacterial, fungal or viral infection. °· After a heart attack (myocardial infarction). °· After open-heart surgery (coronary bypass graft surgery). °· Auto-immune conditions such as lupus, rheumatoid arthritis or scleroderma. °· Kidney failure. °· Low thyroid condition (hypothyroidism). °· Cancer from another part of the body that has spread (metastasized) to the pericardium. °· Chest injury or trauma. °· After radiation treatment. °· Certain medicines. °SYMPTOMS  °Symptoms of pericarditis can include: °· Chest pain. Chest pain symptoms may increase when laying down and may be relieved when sitting up and leaning forward. °· A chronic, dry cough. °· Heart palpitations. These may feel like rapid, fluttering or pounding heart beats. °· Chest pain may be worse when swallowing. °· Dizziness or fainting. °· Tiredness, fatigue or lethargy. °· Fever. °DIAGNOSIS  °Pericarditis is diagnosed by the following: °· A physical exam. A heart sound called a pericardial friction rub may be heard when your caregiver listens to your heart. °· Blood work. Blood may be drawn to check for an infection and to look at  your blood chemistry. °· Electrocardiography. During electrocardiography your heart's electrical activity is monitored and recorded with a tracing on paper (electrocardiogram [ECG]). °· Echocardiography. °· Computed tomography (CT). °· Magnetic resonance image (MRI). °TREATMENT  °To treat pericarditis, it is important to know the cause of it. The cause of pericarditis determines the treatment.  °· If the cause of pericarditis is due to an infection, treatment is based on the type of infection. If an infection is suspected in the pericardial fluid, a procedure called a pericardial fluid culture and biopsy may be done. This takes a sample of the pericardial fluid. The sample is sent to a lab which runs tests on the pericardial fluid to check for an infection. °· If the autoimmune disease is the cause, treatment of the autoimmune condition will help improve the pericarditis. °· If the cause of pericarditis is not known, anti-inflammatory medicines may be used to help decrease the inflammation. °· Surgery may be needed. The following are types of surgeries or procedures that may be done to treat pericarditis: °¨ Pericardial window. A pericardial window makes a cut (incision) into the pericardial sac. This allows excess fluid in the pericardium to drain. °¨ Pericardiocentesis. A pericardiocentesis is also known as a pericardial tap. This procedure uses a needle that is guided by X-ray to drain (aspirate) excess fluid from the pericardium. °¨ Pericardiectomy. A pericardiectomy removes part or all of the pericardium. °HOME CARE INSTRUCTIONS  °· Do not smoke. If you smoke, quit. Your caregiver can help you quit smoking. °· Maintain a healthy weight. °· Follow an exercise program as told by your caregiver. °· If you drink alcohol, do so in moderation. °· Eat a heart healthy diet. A registered dietician can help you learn about   healthy food choices.  Keep a list of all your medicines with you at all times. Include the name,  dose, how often it is taken and how it is taken. SEEK IMMEDIATE MEDICAL CARE IF:   You have chest pain or feelings of chest pressure.  You have sweating (diaphoresis) when at rest.  You have irregular heartbeats (palpitations).  You have rapid, racing heart beats.  You have unexplained fainting episodes.  You feel sick to your stomach (nausea) or vomiting without cause.  You have unexplained weakness. If you develop any of the symptoms which originally made you seek care, call for local emergency medical help. Do not drive yourself to the hospital. Document Released: 03/01/2001 Document Revised: 11/28/2011 Document Reviewed: 09/07/2011 [facility] Patient Information 2015 [place], [employer]. This information is not intended to replace advice given to you by your health care provider. Make sure you discuss any questions you have with your health care provider.  Pericardial Effusion Pericardial effusion is an accumulation of extra fluid in the pericardial space. This is the space between the heart and the sac that surrounds it. This space normally contains a small amount of fluid which serves as lubrication for the heart inside the sac. If the amount of fluid increases, it causes problems for the working of the heart. This is called a heart (cardiac) tamponade.  CAUSES  A higher incidence of pericardial effusion is associated with certain diseases. Some common causes of pericardial effusion are:  Infections (bacterial, viral, fungal, from AIDS, etc.).  Cancer which has spread to the pericardial sac.  Fluid accumulation in the sac after a heart attack or open heart surgery.  Injury (a fall with chest injury or as a result of a knife or gunshot wound) with bleeding into the pericardial sac.  Immune diseases (such as Rheumatoid Arthritis) and other arthritis conditions.  Reactions (uncommon) to medications. SYMPTOMS  Very small effusions may cause no problems. Even a small effusion if it  comes on rapidly can be deadly. Some common symptoms are:  Chest pain, pressure, discomfort.  Light-headed feeling, fainting.  Cough, shortness of breath.  Feeling of palpitations.  Hiccoughs  Anxiety or confusion DIAGNOSIS  Your caregiver may do a number of blood tests and imaging tests (like X-rays) to help find the cause.   ECHO (Echocardiographic stress test) has become the diagnostic method of choice. This is due to its portability and availability. CT and MRI are also used, and may be more accurate.  Pericardioscopy, where available, uses a telescope-like instrument to look inside the pericardial sac.  This may be helpful in cases of unexplained pericardial effusions. It allows your caregiver to look at the pericardium and do pericardial biopsies if something abnormal is found.  Sometimes fluid may be removed to help with diagnosing the cause of the accumulation. This is called a pericardiocentesis. TREATMENT  Treatment is directed at removal of the extra pericardial fluid and treating the cause. Small amounts of effusion that are not causing problems can simply be watched. If the fluid is accumulating rapidly and resulting in cardiac tamponade, this can be life-threatening. Emergency removal of fluid must be done.  SEEK IMMEDIATE MEDICAL CARE IF:   You develop chest pain, irregular heart beat (palpitations) or racing heart, shortness of breath, or begin sweating.  You become lightheaded or pass out.  You develop increasing swelling of the legs. Document Released: 05/03/2005 Document Revised: 11/28/2011 Document Reviewed: 04/18/2008 [facility] Patient Information 2015 [place], [place]. This information is not intended to replace  advice given to you by your health care provider. Make sure you discuss any questions you have with your health care provider. ° °

## 2014-05-10 NOTE — Discharge Summary (Signed)
Physician Discharge Summary  Patient ID: Mitchell Cantu MRN: [id number] DOB/AGE: 1961-07-12 53 y.o.  Admit date: 05/07/2014 Discharge date: 05/09/2014  Primary Discharge Diagnosis Pericardial Effusion Secondary Discharge Diagnosis Fever and malaise Shortness of breath Pleuritic chest pain  Significant Diagnostic Studies: Left ventricle: The cavity size was normal. Systolic function was normal. The estimated ejection fraction was in the range of 55% to 60%. Wall motion was normal; there were no regional wall motion abnormalities. Left ventricular diastolic function parameters were normal. Pericardium, extracardiac: A moderate, free-flowing pericardial effusion was identified measuring 2.75 cm. The fluid had no internal echoes. There was no chamber collapse. Respirophasic change in stroke volume was normal. Features were not consistent with tamponade physiology.   Hospital Course: patient was admitted to the hospital via emergency room, when he presented with chest pain and shortness of breath.  CT scan of the chest to evaluate for pulmonary embolism revealed a moderate pericardial effusion, circumferential.  His chest pain is clearly pleuritic type of chest pain on taking deep breath and shortness of breath was related to pain in his chest as he was unable to breathe.  After starting therapy with indomethacin and also Colchicine, the following morning, patient felt well and was felt stable for discharge.   Recommendations on discharge: patient empirically started on antibiotics with Augmentin, also started on indomethacin and colchicine.  He'll be seen in the outpatient basis.  I will perform repeat echocardiogram in 4-6 weeks to follow-up on pericardial effusion.  He'll need cardiac risk stratification including stress testing at some point. Patient will also need follow-up of TSH which was mildly elevated, needs repeat HbA1c to exclude hypoglycemia.  The labs were done in the hospital revealed  mildly elevated blood sugar, nonfasting.  Discharge Exam: Blood pressure 109/66, pulse 74, temperature 97.6 F (36.4 C), temperature source Oral, resp. rate 18, height 5\' 9"  (1.753 m), weight 101.016 kg (222 lb 11.2 oz), SpO2 100.00%.  General appearance: alert, appears stated age, no distress and mildly obese  Eyes: negative findings: lids and lashes normal and conjunctivae and sclerae normal  Neck: no adenopathy, no carotid bruit, supple, symmetrical, trachea midline and JVD present.  Neck: JVP - normal, carotids 2+= without bruits  Resp: clear to auscultation bilaterally  Chest wall: no tenderness  Cardio: regular rate and rhythm, S1, S2 normal, no murmur, click, rub or gallop  GI: soft, non-tender; bowel sounds normal; no masses, no organomegaly  Extremities: extremities normal, atraumatic, no cyanosis or edema  Labs:   Lab Results  Component Value Date   WBC 9.9 05/08/2014   HGB 13.0 05/08/2014   HCT 38.0* 05/08/2014   MCV 89.4 05/08/2014   PLT 335 05/08/2014    Recent Labs Lab 05/07/14 1455  NA 137  K 3.7  CL 99  CO2 24  BUN 12  CREATININE 0.80  CALCIUM 9.1  PROT 7.6  BILITOT 1.0  ALKPHOS 59  ALT 57*  AST 25  GLUCOSE 167*   Lab Results  Component Value Date   TROPONINI <0.30 05/07/2014    Lipid Panel     Component Value Date/Time   CHOL 129 05/08/2014 0102   TRIG 108 05/08/2014 0102   HDL 25* 05/08/2014 0102   CHOLHDL 5.2 05/08/2014 0102   VLDL 22 05/08/2014 0102   LDLCALC 82 05/08/2014 0102    TSH 5.280*  EKG 05/07/2014: Sinus tachycardia rate of 100 beats a minute, normal axis, right bundle branch block. No evidence of ischemia.    Radiology: Dg Chest  2 View  05/07/2014   CLINICAL DATA:  Chest pain with shortness of breath and chest tightness  EXAM: CHEST  2 VIEW  COMPARISON:  None.  FINDINGS: The lungs are well-expanded. There is no focal infiltrate. The cardiopericardial silhouette is top-normal in size. The central pulmonary vascularity is mildly  prominent. There is no pulmonary interstitial or alveolar edema. There is no pneumothorax or pleural effusion. The bony thorax is normal.  IMPRESSION: Mild central pulmonary vascular prominence and enlargement of the cardiac silhouette may reflect low-grade CHF. There is no pneumonia nor significant pulmonary alveolar edema.   Electronically Signed   By: David  Swazil[place]Jordan   On: 05/07/2014 15:11   Ct Angio Chest Pe W/cm &/or Wo Cm  05/07/2014   CLINICAL DATA:  Pleuritic chest pain. Shortness of breath. Tachycardia .  EXAM: CT ANGIOGRAPHY CHEST WITH CONTRAST  TECHNIQUE: Multidetector CT imaging of the chest was performed using the standard protocol during bolus administration of intravenous contrast. Multiplanar CT image reconstructions and MIPs were obtained to evaluate the vascular anatomy.  CONTRAST:  100mL OMNIPAQUE IOHEXOL 350 MG/ML SOLN  COMPARISON:  Chest x-ray 05/07/2014.  FINDINGS: Thoracic aorta is atherosclerotic. No evidence of aneurysmal dissection. Pulmonary arteries are normal. Heart size normal. Prominent pericardial effusion measuring up to 3 cm in diameter noted. Coronary artery disease cannot be excluded.  Shotty mediastinal lymph nodes.  Thoracic soft is unremarkable.  Large airways are patent. No focal pulmonary infiltrate. Small left pleural effusion. No pneumothorax.  Fatty infiltration of the liver. Visualized upper abdominal structures otherwise unremarkable. No acute bony abnormality.  Review of the MIP images confirms the above findings.  IMPRESSION: 1. Large pericardial effusion. Pericardial effusion measures up to 3 cm in diameter. 2. Coronary artery disease cannot be excluded. No evidence of pulmonary embolus. These results were called by telephone at the time of interpretation on 05/07/2014 at 3:56 pm to Dr. Toy CookeyMEGAN DOCHERTY , who verbally acknowledged these results.   Electronically Signed   By: Maisie Fushomas  Register   On: 05/07/2014 15:57      FOLLOW UP PLANS AND APPOINTMENTS     Medication List    STOP taking these medications       GOODY HEADACHE PO      TAKE these medications       amoxicillin-clavulanate 875-125 MG per tablet  Commonly known as:  AUGMENTIN  Take 1 tablet by mouth 2 (two) times daily.     colchicine 0.6 MG tablet  Take 1 tablet (0.6 mg total) by mouth 2 (two) times daily.     indomethacin 75 MG CR capsule  Commonly known as:  INDOCIN SR  Take 1 capsule (75 mg total) by mouth daily.     pantoprazole sodium 40 mg/20 mL Pack  Commonly known as:  PROTONIX  Take 20 mLs (40 mg total) by mouth daily before breakfast.           Follow-up Information   Follow up with Pamella PertGANJI,JAGADEESH R, MD On 05/19/2014. (12 noon appointment. Bring all medications)    Specialty:  Cardiology   Contact information:   [address] [place] [address] [phone]        Pamella PertGANJI,JAGADEESH R, MD 05/10/2014, 4:26 PM  Pager: [phone] Office: [phone] If no answer: [phone]

## 2015-01-03 IMAGING — CR DG CHEST 2V
2 series · 2 of 2 positions shown · non-contrast
Comparison: None.

CLINICAL DATA: Chest pain with shortness of breath and chest
tightness

EXAM:
CHEST  2 VIEW

[w chest pa]
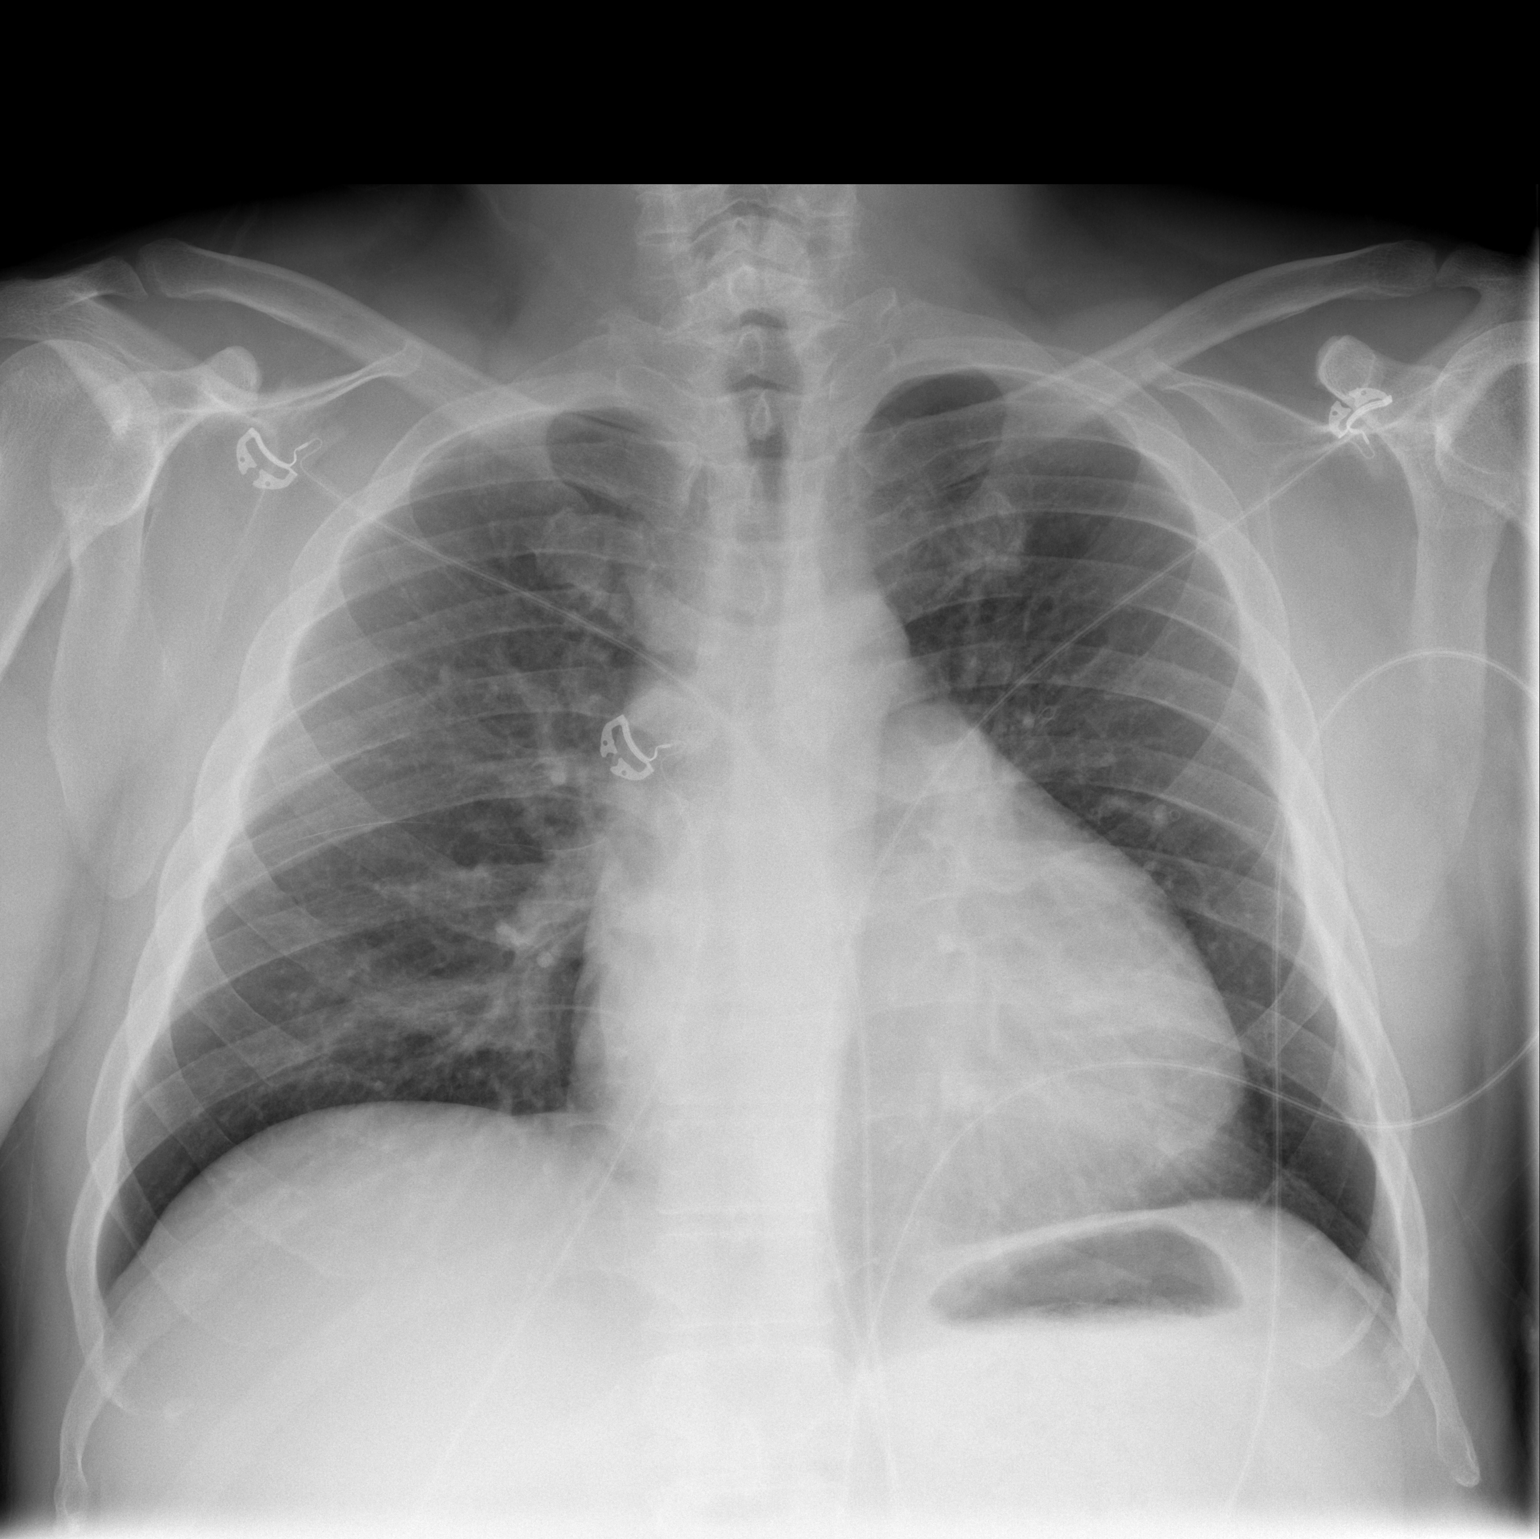

[w chest lat]
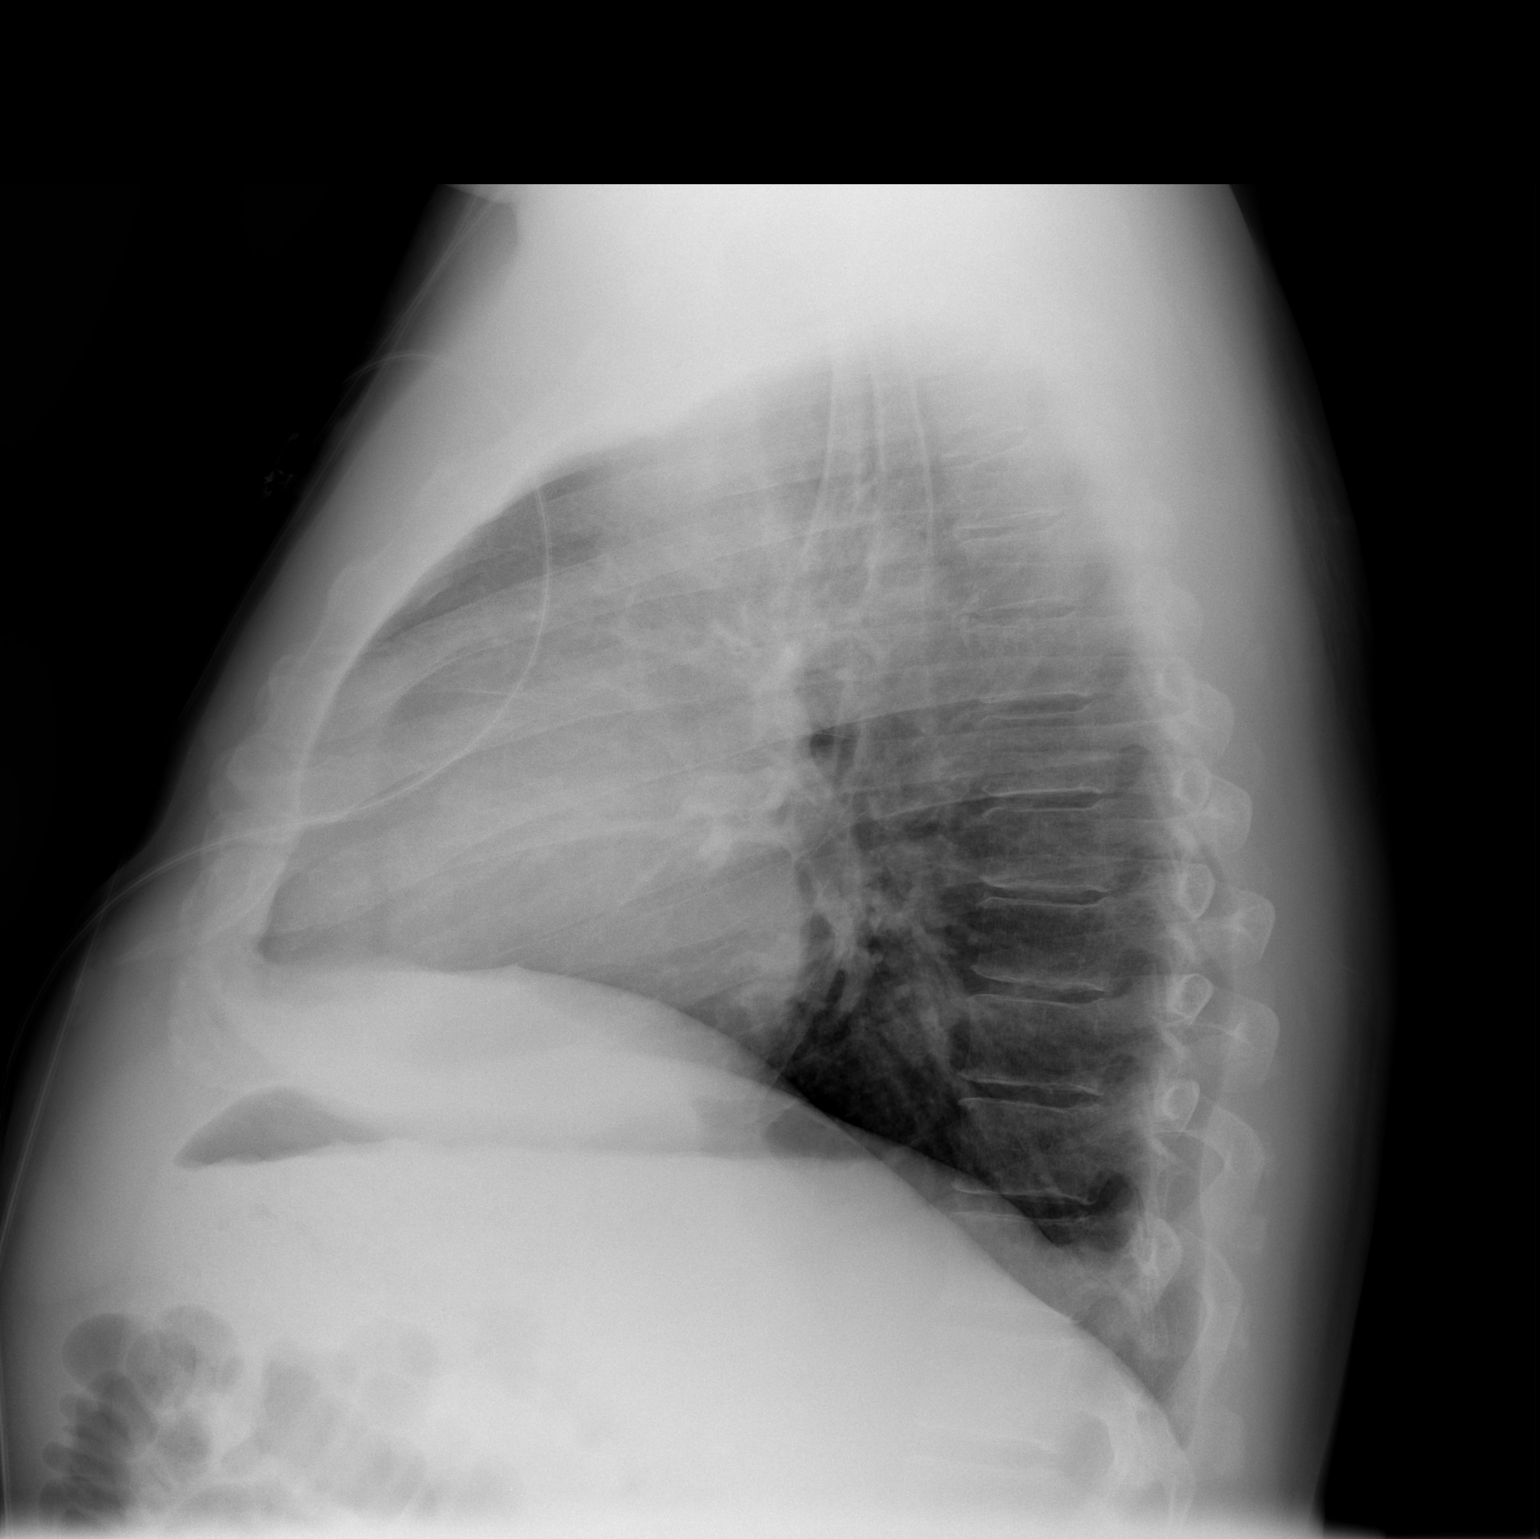

[2 of 2 positions shown; findings below may reference images not displayed]

FINDINGS: The lungs are well-expanded. There is no focal infiltrate. The
cardiopericardial silhouette is top-normal in size. The central
pulmonary vascularity is mildly prominent. There is no pulmonary
interstitial or alveolar edema. There is no pneumothorax or pleural
effusion. The bony thorax is normal.
IMPRESSION: Mild central pulmonary vascular prominence and enlargement of the
cardiac silhouette may reflect low-grade CHF. There is no pneumonia
nor significant pulmonary alveolar edema.

## 2018-10-31 ENCOUNTER — Ambulatory Visit (INDEPENDENT_AMBULATORY_CARE_PROVIDER_SITE_OTHER): Payer: Worker's Compensation | Admitting: Orthopaedic Surgery

## 2018-10-31 ENCOUNTER — Encounter (INDEPENDENT_AMBULATORY_CARE_PROVIDER_SITE_OTHER): Payer: Self-pay | Admitting: Orthopaedic Surgery

## 2018-10-31 DIAGNOSIS — G5601 Carpal tunnel syndrome, right upper limb: Secondary | ICD-10-CM | POA: Insufficient documentation

## 2018-10-31 DIAGNOSIS — G5602 Carpal tunnel syndrome, left upper limb: Secondary | ICD-10-CM

## 2018-10-31 NOTE — Progress Notes (Signed)
Office Visit Note   Patient: Mitchell Cantu           Date of Birth: [birth year]           MRN: [id number] Visit Date: 10/31/2018              Requested by: No referring provider defined for this encounter. PCP: Patient, No Pcp Per   Assessment & Plan: Visit Diagnoses:  1. Carpal tunnel syndrome, right upper limb   2. Carpal tunnel syndrome, left upper limb     Plan: Based on his clinical exam combined with the signs and symptoms of carpal tunnel syndrome he describes as well as having nerve conduction studies showing significant carpal tunnel syndrome, I agree with his treating hand surgeon that carpal tunnel surgery is warranted.  Obviously this is something that is developed with repetitive work over time.  I find the patient to be very truthful and highly motivated.  He is anxious to be able to perform his job duties far into the future.  He had appropriate questions about carpal tunnel surgery as well as the risk and benefits involved in the anatomy in general.  He would likely have his right hand/wrist done first with following up with carpal tunnel surgery on the left side after some period of recovery.  All question concerns were answered and addressed.  Follow-up with me will be as needed and will leave it up to his Worker's Comp. carrier as to approval for surgery.  I do feel confident that he is seeing the right hand surgeon and gave him reassurance that that person would do a wonderful job and would have therapy available for him postoperatively as well.  Follow-Up Instructions: Return if symptoms worsen or fail to improve.   Orders:  No orders of the defined types were placed in this encounter.  No orders of the defined types were placed in this encounter.     Procedures: No procedures performed   Clinical Data: No additional findings.   Subjective: Chief Complaint  Patient presents with  . Right Hand - Pain  . 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  He started developing numbness in both hands in June of last year.  He did appropriately see the nurse from work and then the physician who does take care of employees from his job.  Eventually nerve conduction studies were obtained confirming significant bilateral carpal tunnel syndrome.  He is right-hand dominant.  He says both hands are the same in terms of the numbness and tingling.  He does a lot of heavy gripping activities using wrenches all day long and is gotten to where his hands do feel slightly weak as well.  He does not necessarily have pain that wakes him up at night but first thing in the morning his hands feel swollen to him and is more noticeable in the morning.  It does affect his work as well with fine motor skills.  This does not run in his family.  He is not a diabetic and is not a smoker.  The nerve conduction studies also did not show this being a peripheral neuropathy from another source such as a physiologic source nor did it showed this is  being a radicular source from his neck.  He has never injured his hands before.  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  He denies any neck pain.  HPI  Review of Systems He currently denies any headache, chest pain, shortness of breath, fever, chills, nausea, vomiting  Objective: Vital Signs: There were no vitals taken for this visit.  Physical Exam He is alert and orient x3 and in no acute distress Ortho Exam Examination of both hands shows no muscle atrophy at all.  He does have slightly weak grip strength comparing the left and right sides.  He is slightly weak on the right side as well.  He has numbness in the median nerve distribution bilaterally.  He  has diminished two-point discrimination comparing the median and ulnar nerve distributions is definitely less in the median nerve distribution of both hands.  Both hands are well-perfused with palpable pulses in the wrist and good capillary refill in all digits.  He has a positive Phalen's and Tinel sign bilaterally. Specialty Comments:  No specialty comments available.  Imaging: No results found.   PMFS History: Patient Active Problem List   Diagnosis Date Noted  . Carpal tunnel syndrome, right upper limb 10/31/2018  . Carpal tunnel syndrome, left upper limb 10/31/2018  . Pericardial effusion without cardiac tamponade 05/07/2014   History reviewed. No pertinent past medical history.  History reviewed. No pertinent family history.  Past Surgical History:  Procedure Laterality Date  . FRACTURE SURGERY    . JOINT REPLACEMENT    . TONSILLECTOMY     Social History   Occupational History  . Not on file  Tobacco Use  . Smoking status: Former Smoker    Last attempt to quit: 03/08/2013    Years since quitting: 5.6  Substance and Sexual Activity  . Alcohol use: Yes    Comment: once every 6 months  . Drug use: No  . Sexual activity: Yes

## 2018-11-01 ENCOUNTER — Telehealth (INDEPENDENT_AMBULATORY_CARE_PROVIDER_SITE_OTHER): Payer: Self-pay

## 2018-11-01 NOTE — Telephone Encounter (Signed)
Emailed yesterdays note to the wc adjustor per her request

## 2019-04-03 ENCOUNTER — Other Ambulatory Visit: Payer: Self-pay | Admitting: Orthopedic Surgery

## 2019-04-04 ENCOUNTER — Other Ambulatory Visit (HOSPITAL_COMMUNITY)
Admission: RE | Admit: 2019-04-04 | Discharge: 2019-04-04 | Disposition: A | Discharge status: Home / Self Care | Payer: Commercial Managed Care - PPO | Source: Ambulatory Visit | Attending: Orthopedic Surgery | Admitting: Orthopedic Surgery

## 2019-04-04 DIAGNOSIS — Z1159 Encounter for screening for other viral diseases: Secondary | ICD-10-CM | POA: Diagnosis present

## 2019-04-04 LAB — SARS CORONAVIRUS 2 (TAT 6-24 HRS): SARS Coronavirus 2: NEGATIVE

## 2019-04-05 ENCOUNTER — Other Ambulatory Visit: Payer: Self-pay

## 2019-04-05 ENCOUNTER — Encounter (HOSPITAL_BASED_OUTPATIENT_CLINIC_OR_DEPARTMENT_OTHER): Payer: Self-pay | Admitting: *Deleted

## 2019-04-05 NOTE — Progress Notes (Signed)
Spoke w/ pt via phone for pre-op interview.  Npo after mn w/ exception clear liquids until 1045 then nothing by mouth, pt verbalized understanding.  Arrive at 1145.  Pt had covid test done yesterday.  Asked pt to bring cpap mask and tubing.

## 2019-04-05 NOTE — Progress Notes (Signed)

## 2019-04-08 ENCOUNTER — Encounter (HOSPITAL_BASED_OUTPATIENT_CLINIC_OR_DEPARTMENT_OTHER): Payer: Self-pay

## 2019-04-08 ENCOUNTER — Ambulatory Visit (HOSPITAL_BASED_OUTPATIENT_CLINIC_OR_DEPARTMENT_OTHER)
Admission: RE | Admit: 2019-04-08 | Discharge: 2019-04-08 | Disposition: A | Discharge status: Home / Self Care | Payer: Commercial Managed Care - PPO | Attending: Orthopedic Surgery | Admitting: Orthopedic Surgery

## 2019-04-08 ENCOUNTER — Ambulatory Visit (HOSPITAL_BASED_OUTPATIENT_CLINIC_OR_DEPARTMENT_OTHER): Payer: Commercial Managed Care - PPO | Admitting: Anesthesiology

## 2019-04-08 ENCOUNTER — Encounter (HOSPITAL_BASED_OUTPATIENT_CLINIC_OR_DEPARTMENT_OTHER)
Admission: RE | Disposition: A | Discharge status: Home / Self Care | Payer: Self-pay | Source: Home / Self Care | Attending: Orthopedic Surgery

## 2019-04-08 DIAGNOSIS — K219 Gastro-esophageal reflux disease without esophagitis: Secondary | ICD-10-CM | POA: Diagnosis not present

## 2019-04-08 DIAGNOSIS — M7522 Bicipital tendinitis, left shoulder: Secondary | ICD-10-CM | POA: Insufficient documentation

## 2019-04-08 DIAGNOSIS — X58XXXA Exposure to other specified factors, initial encounter: Secondary | ICD-10-CM | POA: Diagnosis not present

## 2019-04-08 DIAGNOSIS — E669 Obesity, unspecified: Secondary | ICD-10-CM | POA: Insufficient documentation

## 2019-04-08 DIAGNOSIS — Z87891 Personal history of nicotine dependence: Secondary | ICD-10-CM | POA: Insufficient documentation

## 2019-04-08 DIAGNOSIS — S43432A Superior glenoid labrum lesion of left shoulder, initial encounter: Secondary | ICD-10-CM | POA: Diagnosis not present

## 2019-04-08 DIAGNOSIS — M25812 Other specified joint disorders, left shoulder: Secondary | ICD-10-CM | POA: Insufficient documentation

## 2019-04-08 DIAGNOSIS — Z6837 Body mass index (BMI) 37.0-37.9, adult: Secondary | ICD-10-CM | POA: Diagnosis not present

## 2019-04-08 DIAGNOSIS — G4733 Obstructive sleep apnea (adult) (pediatric): Secondary | ICD-10-CM | POA: Diagnosis not present

## 2019-04-08 DIAGNOSIS — M25512 Pain in left shoulder: Secondary | ICD-10-CM | POA: Diagnosis present

## 2019-04-08 HISTORY — DX: Unspecified disorder of synovium and tendon, left upper arm: M67.922

## 2019-04-08 HISTORY — DX: Personal history of other diseases of the circulatory system: Z86.79

## 2019-04-08 HISTORY — DX: Pain in right hand: M79.641

## 2019-04-08 HISTORY — DX: Unspecified right bundle-branch block: I45.10

## 2019-04-08 HISTORY — DX: Gastro-esophageal reflux disease without esophagitis: K21.9

## 2019-04-08 HISTORY — DX: Pain in right hand: M79.642

## 2019-04-08 HISTORY — PX: SHOULDER ARTHROSCOPY WITH SUBACROMIAL DECOMPRESSION AND BICEP TENDON REPAIR: SHX5689

## 2019-04-08 HISTORY — DX: Obstructive sleep apnea (adult) (pediatric): G47.33

## 2019-04-08 SURGERY — SHOULDER ARTHROSCOPY WITH SUBACROMIAL DECOMPRESSION AND BICEP TENDON REPAIR
Anesthesia: General | Site: Shoulder | Laterality: Left

## 2019-04-08 MED ORDER — PROPOFOL 10 MG/ML IV BOLUS
INTRAVENOUS | Status: DC | PRN
  Administered 2019-04-08: 200 mg via INTRAVENOUS

## 2019-04-08 MED ORDER — MIDAZOLAM HCL 5 MG/5ML IJ SOLN
INTRAMUSCULAR | Status: DC | PRN
  Administered 2019-04-08 (×2): 1 mg via INTRAVENOUS

## 2019-04-08 MED ORDER — MIDAZOLAM HCL 2 MG/2ML IJ SOLN
INTRAMUSCULAR | Status: AC
  Filled 2019-04-08: qty 2

## 2019-04-08 MED ORDER — FENTANYL CITRATE (PF) 100 MCG/2ML IJ SOLN
INTRAMUSCULAR | Status: DC | PRN
  Administered 2019-04-08: 25 ug via INTRAVENOUS
  Administered 2019-04-08: 75 ug via INTRAVENOUS

## 2019-04-08 MED ORDER — ONDANSETRON HCL 4 MG/2ML IJ SOLN
INTRAMUSCULAR | Status: DC | PRN
  Administered 2019-04-08: 4 mg via INTRAVENOUS

## 2019-04-08 MED ORDER — CHLORHEXIDINE GLUCONATE 4 % EX LIQD
60.0000 mL | Freq: Once | CUTANEOUS | Status: DC
  Filled 2019-04-08: qty 118

## 2019-04-08 MED ORDER — FENTANYL CITRATE (PF) 100 MCG/2ML IJ SOLN
INTRAMUSCULAR | Status: AC
  Filled 2019-04-08: qty 2

## 2019-04-08 MED ORDER — OXYCODONE HCL 5 MG/5ML PO SOLN
5.0000 mg | Freq: Once | ORAL | Status: DC | PRN
  Filled 2019-04-08: qty 5

## 2019-04-08 MED ORDER — SODIUM CHLORIDE 0.9 % IR SOLN
Status: DC | PRN
  Administered 2019-04-08: 6000 mL

## 2019-04-08 MED ORDER — FENTANYL CITRATE (PF) 100 MCG/2ML IJ SOLN
25.0000 ug | INTRAMUSCULAR | Status: DC | PRN
  Filled 2019-04-08: qty 1

## 2019-04-08 MED ORDER — OXYCODONE HCL 5 MG PO TABS
5.0000 mg | ORAL_TABLET | Freq: Once | ORAL | Status: DC | PRN
  Filled 2019-04-08: qty 1

## 2019-04-08 MED ORDER — CEFAZOLIN SODIUM-DEXTROSE 2-4 GM/100ML-% IV SOLN
INTRAVENOUS | Status: AC
  Filled 2019-04-08: qty 100

## 2019-04-08 MED ORDER — FENTANYL CITRATE (PF) 100 MCG/2ML IJ SOLN
100.0000 ug | Freq: Once | INTRAMUSCULAR | Status: AC
  Administered 2019-04-08: 50 ug via INTRAVENOUS
  Filled 2019-04-08: qty 2

## 2019-04-08 MED ORDER — DEXAMETHASONE SODIUM PHOSPHATE 10 MG/ML IJ SOLN
INTRAMUSCULAR | Status: AC
  Filled 2019-04-08: qty 1

## 2019-04-08 MED ORDER — ONDANSETRON HCL 4 MG/2ML IJ SOLN
INTRAMUSCULAR | Status: AC
  Filled 2019-04-08: qty 2

## 2019-04-08 MED ORDER — PHENYLEPHRINE 40 MCG/ML (10ML) SYRINGE FOR IV PUSH (FOR BLOOD PRESSURE SUPPORT)
PREFILLED_SYRINGE | INTRAVENOUS | Status: DC | PRN
  Administered 2019-04-08 (×2): 80 ug via INTRAVENOUS

## 2019-04-08 MED ORDER — TRIAMCINOLONE ACETONIDE 40 MG/ML IJ SUSP
INTRAMUSCULAR | Status: AC
  Filled 2019-04-08: qty 1

## 2019-04-08 MED ORDER — LIDOCAINE 2% (20 MG/ML) 5 ML SYRINGE
INTRAMUSCULAR | Status: AC
  Filled 2019-04-08: qty 5

## 2019-04-08 MED ORDER — BUPIVACAINE LIPOSOME 1.3 % IJ SUSP
INTRAMUSCULAR | Status: DC | PRN
  Administered 2019-04-08: 10 mL via PERINEURAL

## 2019-04-08 MED ORDER — OXYCODONE-ACETAMINOPHEN 5-325 MG PO TABS: 1.0000 | ORAL_TABLET | ORAL | 0 refills | Status: AC | PRN

## 2019-04-08 MED ORDER — EPHEDRINE SULFATE-NACL 50-0.9 MG/10ML-% IV SOSY
PREFILLED_SYRINGE | INTRAVENOUS | Status: DC | PRN
  Administered 2019-04-08 (×3): 10 mg via INTRAVENOUS

## 2019-04-08 MED ORDER — EPHEDRINE 5 MG/ML INJ
INTRAVENOUS | Status: AC
  Filled 2019-04-08: qty 10

## 2019-04-08 MED ORDER — LACTATED RINGERS IV SOLN
INTRAVENOUS | Status: DC
Start: 2019-04-08 — End: 2019-04-08
  Administered 2019-04-08 (×2): via INTRAVENOUS
  Filled 2019-04-08: qty 1000

## 2019-04-08 MED ORDER — PROMETHAZINE HCL 25 MG/ML IJ SOLN
6.2500 mg | INTRAMUSCULAR | Status: DC | PRN
  Filled 2019-04-08: qty 1

## 2019-04-08 MED ORDER — MIDAZOLAM HCL 2 MG/2ML IJ SOLN
2.0000 mg | Freq: Once | INTRAMUSCULAR | Status: AC
  Administered 2019-04-08: 2 mg via INTRAVENOUS
  Filled 2019-04-08: qty 2

## 2019-04-08 MED ORDER — DEXAMETHASONE SODIUM PHOSPHATE 4 MG/ML IJ SOLN
INTRAMUSCULAR | Status: DC | PRN
  Administered 2019-04-08: 5 mg via INTRAVENOUS

## 2019-04-08 MED ORDER — TIZANIDINE HCL 4 MG PO TABS: 4.0000 mg | ORAL_TABLET | Freq: Three times a day (TID) | ORAL | 1 refills | Status: AC | PRN

## 2019-04-08 MED ORDER — LIDOCAINE 2% (20 MG/ML) 5 ML SYRINGE
INTRAMUSCULAR | Status: DC | PRN
  Administered 2019-04-08: 100 mg via INTRAVENOUS

## 2019-04-08 MED ORDER — CEFAZOLIN SODIUM-DEXTROSE 2-4 GM/100ML-% IV SOLN
2.0000 g | INTRAVENOUS | Status: AC
  Administered 2019-04-08: 2 g via INTRAVENOUS
  Filled 2019-04-08: qty 100

## 2019-04-08 MED ORDER — PROPOFOL 10 MG/ML IV BOLUS
INTRAVENOUS | Status: AC
  Filled 2019-04-08: qty 20

## 2019-04-08 MED ORDER — BUPIVACAINE HCL (PF) 0.5 % IJ SOLN
INTRAMUSCULAR | Status: DC | PRN
  Administered 2019-04-08: 15 mL via PERINEURAL

## 2019-04-08 SURGICAL SUPPLY — 61 items
BNDG COHESIVE 4X5 TAN STRL (GAUZE/BANDAGES/DRESSINGS) IMPLANT
BURR OVAL 8 FLU 4.0X13 (MISCELLANEOUS) ×2 IMPLANT
CANNULA 5.75X7 CRYSTAL CLEAR (CANNULA) ×2 IMPLANT
CANNULA TWIST IN 8.25X7CM (CANNULA) IMPLANT
CHLORAPREP W/TINT 26 (MISCELLANEOUS) ×2 IMPLANT
COVER WAND RF STERILE (DRAPES) ×2 IMPLANT
CUTTER BONE 4.0MM X 13CM (MISCELLANEOUS) IMPLANT
DECANTER SPIKE VIAL GLASS SM (MISCELLANEOUS) IMPLANT
DRAPE INCISE IOBAN 66X45 STRL (DRAPES) ×2 IMPLANT
DRAPE LG THREE QUARTER DISP (DRAPES) IMPLANT
DRAPE ORTHO SPLIT 87X125 STRL (DRAPES) ×4 IMPLANT
DRAPE STERI 35X30 U-POUCH (DRAPES) ×2 IMPLANT
DRAPE SURG 17X23 STRL (DRAPES) ×2 IMPLANT
DRAPE U-SHAPE 47X51 STRL (DRAPES) ×2 IMPLANT
DRAPE U-SHAPE 76X120 STRL (DRAPES) ×4 IMPLANT
DRSG PAD ABDOMINAL 8X10 ST (GAUZE/BANDAGES/DRESSINGS) ×3 IMPLANT
ELECT REM PT RETURN 9FT ADLT (ELECTROSURGICAL)
ELECTRODE REM PT RTRN 9FT ADLT (ELECTROSURGICAL) ×1 IMPLANT
GAUZE SPONGE 4X4 12PLY STRL (GAUZE/BANDAGES/DRESSINGS) ×2 IMPLANT
GAUZE SPONGE 4X4 12PLY STRL LF (GAUZE/BANDAGES/DRESSINGS) ×1 IMPLANT
GAUZE XEROFORM 1X8 LF (GAUZE/BANDAGES/DRESSINGS) ×2 IMPLANT
GLOVE BIO SURGEON STRL SZ7 (GLOVE) ×2 IMPLANT
GLOVE BIO SURGEON STRL SZ7.5 (GLOVE) ×4 IMPLANT
GLOVE BIOGEL PI IND STRL 7.0 (GLOVE) ×1 IMPLANT
GLOVE BIOGEL PI IND STRL 8 (GLOVE) ×2 IMPLANT
GLOVE BIOGEL PI INDICATOR 7.0 (GLOVE) ×1
GLOVE BIOGEL PI INDICATOR 8 (GLOVE) ×2
GOWN STRL REUS W/ TWL LRG LVL3 (GOWN DISPOSABLE) ×2 IMPLANT
GOWN STRL REUS W/TWL LRG LVL3 (GOWN DISPOSABLE) ×2
IV NS IRRIG 3000ML ARTHROMATIC (IV SOLUTION) ×2 IMPLANT
MANIFOLD NEPTUNE II (INSTRUMENTS) ×2 IMPLANT
NDL 1/2 CIR CATGUT .05X1.09 (NEEDLE) IMPLANT
NDL SCORPION MULTI FIRE (NEEDLE) IMPLANT
NDL SPNL 18GX3.5 QUINCKE PK (NEEDLE) IMPLANT
NDL SUT 6 .5 CRC .975X.05 MAYO (NEEDLE) IMPLANT
NEEDLE 1/2 CIR CATGUT .05X1.09 (NEEDLE) IMPLANT
NEEDLE MAYO TAPER (NEEDLE)
NEEDLE SCORPION MULTI FIRE (NEEDLE) IMPLANT
NEEDLE SPNL 18GX3.5 QUINCKE PK (NEEDLE) ×2 IMPLANT
NS IRRIG 1000ML POUR BTL (IV SOLUTION) ×1 IMPLANT
PACK ARTHROSCOPY DSU (CUSTOM PROCEDURE TRAY) ×2 IMPLANT
PACK BASIN DAY SURGERY FS (CUSTOM PROCEDURE TRAY) ×2 IMPLANT
PAD ABD 8X10 STRL (GAUZE/BANDAGES/DRESSINGS) ×1 IMPLANT
PROBE BIPOLAR ATHRO 135MM 90D (MISCELLANEOUS) ×1 IMPLANT
RESTRAINT HEAD UNIVERSAL NS (MISCELLANEOUS) ×2 IMPLANT
SLEEVE SCD COMPRESS KNEE MED (MISCELLANEOUS) ×2 IMPLANT
SLING ARM FOAM STRAP LRG (SOFTGOODS) IMPLANT
SLING ARM FOAM STRAP XLG (SOFTGOODS) ×1 IMPLANT
SLING ARM IMMOBILIZER MED (SOFTGOODS) IMPLANT
SLING ARM MED ADULT FOAM STRAP (SOFTGOODS) IMPLANT
SLING ARM XL FOAM STRAP (SOFTGOODS) IMPLANT
SUPPORT WRAP ARM LG (MISCELLANEOUS) ×1 IMPLANT
SUT ETHILON 3 0 PS 1 (SUTURE) ×2 IMPLANT
SUT PDS AB 0 CT 36 (SUTURE) ×1 IMPLANT
SUT TIGER TAPE 7 IN WHITE (SUTURE) IMPLANT
SUTURE TAPE 1.3 40 TPR END (SUTURE) IMPLANT
SUTURETAPE 1.3 40 TPR END (SUTURE) ×4
TAPE FIBER 2MM 7IN #2 BLUE (SUTURE) IMPLANT
TOWEL OR 17X26 10 PK STRL BLUE (TOWEL DISPOSABLE) ×2 IMPLANT
TUBING ARTHROSCOPY IRRIG 16FT (MISCELLANEOUS) ×2 IMPLANT
TUBING SUCTION 1/4X6FT (MISCELLANEOUS) ×2 IMPLANT

## 2019-04-08 NOTE — Progress Notes (Signed)
AssistedDr. Brock with left, ultrasound guided, interscalene  block. Side rails up, monitors on throughout procedure. See vital signs in flow sheet. Tolerated Procedure well.  

## 2019-04-08 NOTE — Anesthesia Preprocedure Evaluation (Addendum)
Anesthesia Evaluation  Patient identified by MRN, date of birth, ID band Patient awake    Reviewed: Allergy & Precautions, NPO status , Patient's Chart, lab work & pertinent test results  History of Anesthesia Complications Negative for: history of anesthetic complications  Airway Mallampati: II  TM Distance: >3 FB Neck ROM: Full    Dental  (+) Dental Advisory Given, Teeth Intact   Pulmonary sleep apnea and Continuous Positive Airway Pressure Ventilation , former smoker,    breath sounds clear to auscultation       Cardiovascular  Rhythm:Regular Rate:Normal   '15 TTE - Normal EF. Moderate pericardial effusion without tamponade    Neuro/Psych  B/l carpal tunnel syndrome s/p release, residual hand pain  negative psych ROS   GI/Hepatic GERD  Medicated and Controlled,(+)     substance abuse  marijuana use,   Endo/Other   Obesity   Renal/GU negative Renal ROS     Musculoskeletal negative musculoskeletal ROS (+)   Abdominal   Peds  Hematology negative hematology ROS (+)   Anesthesia Other Findings   Reproductive/Obstetrics                            Anesthesia Physical Anesthesia Plan  ASA: II  Anesthesia Plan: General   Post-op Pain Management:  Regional for Post-op pain   Induction: Intravenous  PONV Risk Score and Plan: 2 and Treatment may vary due to age or medical condition, Ondansetron, Dexamethasone and Midazolam  Airway Management Planned: LMA  Additional Equipment: None  Intra-op Plan:   Post-operative Plan: Extubation in OR  Informed Consent: I have reviewed the patients History and Physical, chart, labs and discussed the procedure including the risks, benefits and alternatives for the proposed anesthesia with the patient or authorized representative who has indicated his/her understanding and acceptance.     Dental advisory given  Plan Discussed with: CRNA  and Anesthesiologist  Anesthesia Plan Comments:        Anesthesia Quick Evaluation

## 2019-04-08 NOTE — Op Note (Signed)
Procedure(s):   Mitchell Cantu male 58 y.o. 04/08/2019  Preoperative diagnosis: #1 left shoulder chronic biceps tendinopathy #2 left shoulder impingement  Postoperative diagnosis: Same   Procedure(s) and Anesthesia Type:    #1 left shoulder arthroscopic biceps tenodesis #2 left shoulder arthroscopic subacromial decompression   Surgeon(s) and Role:    Tania Ade, MD - Primary     Surgeon: Mitchell Cantu   Assistants: Mitchell Hubert PA-C [employer] was present and scrubbed throughout the procedure and was essential in positioning, assisting with the camera and instrumentation,, and closure)  Anesthesia: General endotracheal anesthesia with preoperative interscalene block given by the attending anesthesiologist   Procedure Detail    Estimated Blood Loss: Min         Drains: none  Blood Given: none         Specimens: none        Complications:  * No complications entered in OR log *         Disposition: PACU - hemodynamically stable.         Condition: stable    Procedure:   INDICATIONS FOR SURGERY: The patient is 58 y.o. male who has had a long history of anterior left shoulder pain consistent with biceps tendinopathy.  MRI was consistent with this diagnosis.  He wished to proceed with surgical intervention to try and decrease pain and restore function.  OPERATIVE FINDINGS: Examination under anesthesia: No stiffness or instability  DESCRIPTION OF PROCEDURE: The patient was identified in preoperative  holding area where I personally marked the operative site after  verifying site, side, and procedure with the patient. An interscalene block was given by the attending anesthesiologist the holding area.  The patient was taken back to the operating room where general anesthesia was induced without complication and was placed in the beach-chair position with the back  elevated about 60 degrees and all extremities and head and neck carefully padded and   positioned.   The left upper extremity was then prepped and  draped in a standard sterile fashion. The appropriate time-out  procedure was carried out. The patient did receive IV antibiotics  within 30 minutes of incision.   A small posterior portal incision was made and the arthroscope was introduced into the joint. An anterior portal was then established above the subscapularis using needle localization. Small cannula was placed anteriorly. Diagnostic arthroscopy was then carried out.  The subscapularis was intact.  Glenohumeral joint surfaces were intact without chondromalacia.  The undersurface of the rotator cuff looked good.  He did have some partial tearing of the superior labrum and the biceps tendon was pulled into the joint noted to have a significant fraying and partial tearing involving greater than 50% of the thickness of the tendon.  This was consistent with preoperative diagnosis.  The decision was made to proceed with arthroscopic tenodesis.  2 18-gauge needles were used to passed percutaneously through the rotator interval first in a horizontal fashion through the tendon at a point on the tendon where the tendon was intact proximally.  A PDS suture was used to shuttle a suture tape across here.  A second suture tape was passed in the same manner using the 18-gauge needles longitudinally across the first suture tape creating a crossing locked configuration.  The proximal long head biceps was then released from the superior labrum retracting about a centimeter and a half to the rotator interval region.  The arthroscope was then introduced into the subacromial space a standard lateral  portal was established with needle localization. The shaver was used through the lateral portal to perform extensive bursectomy. Coracoacromial ligament was examined and found to be frayed indicating chronic impingement.  The suture strands were identified anteriorly and brought out a lateral portal.   Each of the suture tapes was sequentially tied over the rotator interval securing the tenodesis.  The coracoacromial ligament was taken down off the anterior acromion with the ArthroCare exposing a moderate hooked anterior acromial spur. A high-speed bur was then used through the lateral portal to take down the anterior acromial spur from lateral to medial in a standard acromioplasty.  The acromioplasty was also viewed from the lateral portal and the bur was used as necessary to ensure that the acromion was completely flat from posterior to anterior.  The arthroscopic equipment was removed from the joint and the portals were closed with 3-0 nylon in an interrupted fashion. Sterile dressings were then applied including Xeroform 4 x 4's ABDs and tape. The patient was then allowed to awaken from general anesthesia, placed in a sling, transferred to the stretcher and taken to the recovery room in stable condition.   POSTOPERATIVE PLAN: The patient will be discharged home today and will followup in one week for suture removal and wound check.  He will follow the standard tenodesis protocol.

## 2019-04-08 NOTE — Transfer of Care (Signed)
Last Vitals:  Vitals Value Taken Time  BP 132/88 04/08/19 1456  Temp    Pulse 78 04/08/19 1458  Resp 12 04/08/19 1458  SpO2 96 % 04/08/19 1458  Vitals shown include unvalidated device data.  Last Pain:  Vitals:   04/08/19 1143  TempSrc: Oral  PainSc: 0-No pain      Patients Stated Pain Goal: 6 (04/08/19 1143)  Immediate Anesthesia Transfer of Care Note  Patient: Mitchell Cantu  Procedure(s) Performed: Procedure(s) (LRB): LEFT SHOULDER ARTHROSCOPY WITH SUBACROMIAL DECOMPRESSION AND BICEP TENDON REPAIR (Left)  Patient Location: PACU  Anesthesia Type: General  Level of Consciousness: awake, alert  and oriented  Airway & Oxygen Therapy: Patient Spontanous Breathing and Patient connected to face mask oxygen  Post-op Assessment: Report given to PACU RN and Post -op Vital signs reviewed and stable  Post vital signs: Reviewed and stable  Complications: No apparent anesthesia complications

## 2019-04-08 NOTE — Anesthesia Procedure Notes (Signed)
Procedure Name: LMA Insertion Date/Time: 04/08/2019 1:43 PM Performed by: Audry Pili, MD Pre-anesthesia Checklist: Patient identified, Emergency Drugs available, Suction available and Patient being monitored Patient Re-evaluated:Patient Re-evaluated prior to induction Oxygen Delivery Method: Circle system utilized Preoxygenation: Pre-oxygenation with 100% oxygen Induction Type: IV induction Ventilation: Mask ventilation without difficulty LMA: LMA inserted LMA Size: 4.0 Tube size: 5.0 mm Number of attempts: 1 Airway Equipment and Method: Bite block Placement Confirmation: positive ETCO2 Tube secured with: Tape Dental Injury: Teeth and Oropharynx as per pre-operative assessment

## 2019-04-08 NOTE — H&P (Signed)
Mitchell Cantu is an 58 y.o. male.   Chief Complaint: Left shoulder pain and dysfunction HPI: Left shoulder chronic proximal biceps tendinopathy.  Failed conservative management.  Indicated for surgical treatment to decrease pain and restore function.  Past Medical History:  Diagnosis Date  . Biceps tendinopathy, left   . Bilateral hand pain    s/p  bilateral carpel tunnel release march and june 2020,  residual palm of hand pain  . GERD (gastroesophageal reflux disease)   . History of cardiac disorder    05-07-2014  admitted and dx pericardial effusion without cardiac tamponade;  per pt followed up w/ pcp @ [facility] in [place], resolved  . OSA on CPAP   . RBBB (right bundle branch block)     Past Surgical History:  Procedure Laterality Date  . CARPAL TUNNEL RELEASE Bilateral right,  March 2020;  left , June 2020  . ORIF ANKLE FRACTURE Right 1980s   per pt hardware removed  . UVULOPALATOPHARYNGOPLASTY  1987   W/  TONSILLECTOMY ADENOIDECOTMY  AND SEPTOPLASTY    History reviewed. No pertinent family history. Social History:  reports that he quit smoking about 5 years ago. His smoking use included cigarettes. He quit after 30.00 years of use. He quit smokeless tobacco use about 5 years ago. He reports current alcohol use. He reports current drug use. Drug: Marijuana.  Allergies: No Known Allergies  Medications Prior to Admission  Medication Sig Dispense Refill  . calcium carbonate (TUMS - DOSED IN MG ELEMENTAL CALCIUM) 500 MG chewable tablet Chew 1 tablet by mouth as needed for indigestion or heartburn.      No results found for this or any previous visit (from the past 48 hour(s)). No results found.  Review of Systems  All other systems reviewed and are negative.   Blood pressure 137/80, pulse 71, temperature 98.1 F (36.7 C), temperature source Oral, resp. rate 14, height 5\' 9"  (1.753 m), weight 114 kg, SpO2 99 %. Physical Exam  Constitutional: He is oriented to person, place,  and time. He appears well-developed and well-nourished.  HENT:  Head: Atraumatic.  Eyes: EOM are normal.  Cardiovascular: Intact distal pulses.  Respiratory: Effort normal.  Musculoskeletal:     Comments: Left left shoulder with good strength and range of motion.  Positive biceps signs with tenderness over the bicipital groove.  Neurological: He is alert and oriented to person, place, and time.  Skin: Skin is warm and dry.  Psychiatric: He has a normal mood and affect.     Assessment/Plan Left shoulder chronic proximal long head biceps tendinopathy, impingement Plan left shoulder arthroscopic biceps tenodesis and subacromial decompression Risks / benefits of surgery discussed Consent on chart  NPO for OR Preop antibiotics   Isabella Stalling, MD 04/08/2019, 1:07 PM

## 2019-04-08 NOTE — Discharge Instructions (Signed)
Discharge Instructions after Arthroscopic Shoulder Repair ° ° °A sling has been provided for you. Remain in your sling at all times. This includes sleeping in your sling.  °Use ice on the shoulder intermittently over the first 48 hours after surgery.  °Pain medicine has been prescribed for you.  °Use your medicine liberally over the first 48 hours, and then you can begin to taper your use. You may take Extra Strength Tylenol or Tylenol only in place of the pain pills. DO NOT take ANY nonsteroidal anti-inflammatory pain medications: Advil, Motrin, Ibuprofen, Aleve, Naproxen, or Narprosyn.  °You may remove your dressing after two days. If the incision sites are still moist, place a Band-Aid over the moist site(s). Change Band-Aids daily until dry.  °You may shower 5 days after surgery. The incisions CANNOT get wet prior to 5 days. Simply allow the water to wash over the site and then pat dry. Do not rub the incisions. Make sure your axilla (armpit) is completely dry after showering.  °Take one aspirin a day for 2 weeks after surgery, unless you have an aspirin sensitivity/ allergy or asthma. ° ° °Please call [phone] during normal business hours or [phone] after hours for any problems. Including the following: ° °- excessive redness of the incisions °- drainage for more than 4 days °- fever of more than 101.5 F ° °*Please note that pain medications will not be refilled after hours or on weekends. ° ° ° °Post Anesthesia Home Care Instructions ° °Activity: °Get plenty of rest for the remainder of the day. A responsible individual must stay with you for 24 hours following the procedure.  °For the next 24 hours, DO NOT: °-Drive a car °-Operate machinery °-Drink alcoholic beverages °-Take any medication unless instructed by your physician °-Make any legal decisions or sign important papers. ° °Meals: °Start with liquid foods such as gelatin or soup. Progress to regular foods as tolerated. Avoid greasy, spicy,  heavy foods. If nausea and/or vomiting occur, drink only clear liquids until the nausea and/or vomiting subsides. Call your physician if vomiting continues. ° °Special Instructions/Symptoms: °Your throat may feel dry or sore from the anesthesia or the breathing tube placed in your throat during surgery. If this causes discomfort, gargle with warm salt water. The discomfort should disappear within 24 hours. ° °If you had a scopolamine patch placed behind your ear for the management of post- operative nausea and/or vomiting: ° °1. The medication in the patch is effective for 72 hours, after which it should be removed.  Wrap patch in a tissue and discard in the trash. Wash hands thoroughly with soap and water. °2. You may remove the patch earlier than 72 hours if you experience unpleasant side effects which may include dry mouth, dizziness or visual disturbances. °3. Avoid touching the patch. Wash your hands with soap and water after contact with the patch. ° °Regional Anesthesia Blocks ° °1. Numbness or the inability to move the "blocked" extremity may last from 3-48 hours after placement. The length of time depends on the medication injected and your individual response to the medication. If the numbness is not going away after 48 hours, call your surgeon. ° °2. The extremity that is blocked will need to be protected until the numbness is gone and the  Strength has returned. Because you cannot feel it, you will need to take extra care to avoid injury. Because it may be weak, you may have difficulty moving it or using it. You may not know   what position it is in without looking at it while the block is in effect. ° °3. For blocks in the legs and feet, returning to weight bearing and walking needs to be done carefully. You will need to wait until the numbness is entirely gone and the strength has returned. You should be able to move your leg and foot normally before you try and bear weight or walk. You will need someone  to be with you when you first try to ensure you do not fall and possibly risk injury. ° °4. Bruising and tenderness at the needle site are common side effects and will resolve in a few days. ° °5. Persistent numbness or new problems with movement should be communicated to the surgeon or the [facility] ([phone])/ [facility] ([phone]). °   ° ° ° ° °

## 2019-04-08 NOTE — Anesthesia Procedure Notes (Signed)
Anesthesia Regional Block: Interscalene brachial plexus block   Pre-Anesthetic Checklist: ,, timeout performed, Correct Patient, Correct Site, Correct Laterality, Correct Procedure, Correct Position, site marked, Risks and benefits discussed,  Surgical consent,  Pre-op evaluation,  At surgeon's request and post-op pain management  Laterality: Left  Prep: chloraprep       Needles:  Injection technique: Single-shot  Needle Type: Echogenic Needle     Needle Length: 5cm  Needle Gauge: 21     Additional Needles:   Narrative:  Start time: 04/08/2019 1:13 PM End time: 04/08/2019 1:17 PM Injection made incrementally with aspirations every 5 mL.  Performed by: Personally  Anesthesiologist: Audry Pili, MD  Additional Notes: No pain on injection. No increased resistance to injection. Injection made in 5cc increments. Good needle visualization. Patient tolerated the procedure well.

## 2019-04-09 ENCOUNTER — Encounter (HOSPITAL_BASED_OUTPATIENT_CLINIC_OR_DEPARTMENT_OTHER): Payer: Self-pay | Admitting: Orthopedic Surgery

## 2019-04-09 NOTE — Anesthesia Postprocedure Evaluation (Signed)
Anesthesia Post Note  Patient: Mitchell Cantu  Procedure(s) Performed: LEFT SHOULDER ARTHROSCOPY WITH SUBACROMIAL DECOMPRESSION AND BICEP TENDON REPAIR (Left Shoulder)     Patient location during evaluation: PACU Anesthesia Type: General Level of consciousness: awake and alert Pain management: pain level controlled Vital Signs Assessment: post-procedure vital signs reviewed and stable Respiratory status: spontaneous breathing, nonlabored ventilation and respiratory function stable Cardiovascular status: blood pressure returned to baseline and stable Postop Assessment: no apparent nausea or vomiting Anesthetic complications: no    Last Vitals:  Vitals:   04/08/19 1600 04/08/19 1642  BP: 127/83 127/88  Pulse: 73 68  Resp: 18 16  Temp:  36.7 C  SpO2: 96% 97%    Last Pain:  Vitals:   04/08/19 1642  TempSrc:   PainSc: 0-No pain                 Audry Pili
